# Patient Record
Sex: Female | Born: 1995 | Race: Black or African American | Hispanic: No | Marital: Single | Smoking: Never smoker
Health system: Southern US, Community
[De-identification: ages and names within clinical notes are randomized; demographics above are authoritative.]

## PROBLEM LIST (undated history)

## (undated) HISTORY — PX: TONSILLECTOMY: SUR1361

## (undated) HISTORY — PX: LYMPH GLAND EXCISION: SHX13

---
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
## 2017-01-06 ENCOUNTER — Ambulatory Visit (INDEPENDENT_AMBULATORY_CARE_PROVIDER_SITE_OTHER): Payer: Self-pay | Admitting: *Deleted

## 2017-01-06 DIAGNOSIS — J309 Allergic rhinitis, unspecified: Secondary | ICD-10-CM

## 2017-01-10 ENCOUNTER — Ambulatory Visit (INDEPENDENT_AMBULATORY_CARE_PROVIDER_SITE_OTHER): Payer: Self-pay

## 2017-01-10 DIAGNOSIS — J309 Allergic rhinitis, unspecified: Secondary | ICD-10-CM

## 2017-01-20 ENCOUNTER — Ambulatory Visit (INDEPENDENT_AMBULATORY_CARE_PROVIDER_SITE_OTHER): Payer: Self-pay

## 2017-01-20 DIAGNOSIS — J309 Allergic rhinitis, unspecified: Secondary | ICD-10-CM

## 2017-02-09 ENCOUNTER — Ambulatory Visit (INDEPENDENT_AMBULATORY_CARE_PROVIDER_SITE_OTHER): Payer: Self-pay

## 2017-02-09 DIAGNOSIS — J309 Allergic rhinitis, unspecified: Secondary | ICD-10-CM

## 2017-03-02 ENCOUNTER — Ambulatory Visit (INDEPENDENT_AMBULATORY_CARE_PROVIDER_SITE_OTHER): Payer: Self-pay

## 2017-03-02 DIAGNOSIS — J309 Allergic rhinitis, unspecified: Secondary | ICD-10-CM

## 2017-03-03 DIAGNOSIS — J3089 Other allergic rhinitis: Secondary | ICD-10-CM

## 2017-03-07 NOTE — Progress Notes (Signed)
VIALS EXP 03-08-18 

## 2017-03-17 ENCOUNTER — Encounter: Payer: Self-pay | Admitting: *Deleted

## 2017-03-28 ENCOUNTER — Ambulatory Visit: Payer: Self-pay

## 2017-06-13 ENCOUNTER — Telehealth: Payer: Self-pay

## 2017-06-13 NOTE — Telephone Encounter (Signed)
Pt has not rec'd inj since October 4.  [facility] nurse Megan wanted confirmation on what dose to give.  Advised to decrease to 0.05 due to length of time.  Confirm pt has had antihistamine & waits 30 minutes.  Then increase dose as tolerated on schedule C.

## 2017-09-01 ENCOUNTER — Telehealth: Payer: Self-pay | Admitting: *Deleted

## 2017-09-01 NOTE — Telephone Encounter (Signed)
Patient walked into the clinic today wanting to get allergy injections, she did not have an appt. She takes vials out of office to [facility] and we did not have her vials here. I called the school (info below) and she has not had an injection since 05/05/17 but they said she did have a new red vial that she has not started that does not expire until 03/08/18. For cost efficiency, the pt needs to pick up the red vial from the school and bring to us, for us to be able to mix down to green. Explained this to pt's mother.    Scanned pts allergy injection shot records into Epic.   [facility] [address] [place], [place] [id number] [phone](P) [phone](F) [phone]

## 2017-09-01 NOTE — Telephone Encounter (Signed)
LVM stating that pt needs to make ov. The school faxed us a consent form for the dr to sign so that she may continue getting shots at the school.

## 2017-09-01 NOTE — Telephone Encounter (Signed)
Just noticed that pt is due to have an office visit with Isabella Huff before any more allergy vials can be made.

## 2017-09-12 ENCOUNTER — Ambulatory Visit: Payer: Self-pay

## 2017-09-30 ENCOUNTER — Ambulatory Visit (INDEPENDENT_AMBULATORY_CARE_PROVIDER_SITE_OTHER): Payer: BLUE CROSS/BLUE SHIELD | Admitting: Allergy & Immunology

## 2017-09-30 ENCOUNTER — Encounter: Payer: Self-pay | Admitting: Allergy & Immunology

## 2017-09-30 VITALS — BP 110/80 | HR 82 | Temp 98.2°F | Resp 16 | Ht 70.0 in | Wt 194.0 lb

## 2017-09-30 DIAGNOSIS — J4520 Mild intermittent asthma, uncomplicated: Secondary | ICD-10-CM | POA: Diagnosis not present

## 2017-09-30 DIAGNOSIS — J3089 Other allergic rhinitis: Secondary | ICD-10-CM

## 2017-09-30 DIAGNOSIS — J302 Other seasonal allergic rhinitis: Secondary | ICD-10-CM | POA: Diagnosis not present

## 2017-09-30 MED ORDER — EPINEPHRINE 0.3 MG/0.3ML IJ SOAJ: 0.3000 mg | Freq: Once | INTRAMUSCULAR | 2 refills | Status: AC

## 2017-09-30 NOTE — Progress Notes (Signed)
NEW PATIENT  Date of Service/Encounter:  09/30/17  Referring provider: Alfred LevinsGoolsby, Kirsten L., PA-C   Assessment:   Mild intermittent asthma without complication  Seasonal and perennial allergic rhinitis  (grasses, ragweed, weeds, trees, molds, dust mite, cat)  Plan/Recommendations:   1. Mild intermittent asthma without complication - Lung testing looked great today. - Continue with albuterol as needed. - This can likely be removed from her diagnosis list at the next visit.   2. Seasonal and perennial allergic rhinitis (grasses, ragweed, weeds, trees, molds, dust mite, cat) - We will give you two shots today and send you back with your vials.  - Please have the clinic call us if there are any questions. - Continue with Xyzal 5mg  daily as needed. - We deferred repeating testing today since she has been stable on her current immunotherapy vial. - We had a discussion about the normal duration of treatment with allergen immunotherapy, but she prefers to hold off on stopping at this point. - We will revisit this at the next appointment.   3. Return in about 1 year (around 10/01/2018).   Subjective:   Isabella Huff is a 22 y.o. female presenting today for evaluation of  Chief Complaint  Patient presents with  . Allergies    Isabella Huff has a history of the following: Patient Active Problem List   Diagnosis Date Noted  . Asthma 03/31/2015  . Allergic rhinitis 03/31/2015    History obtained from: chart review and patient.  Isabella Huff was referred by Alfred LevinsGoolsby, Kirsten L., PA-C.     Isabella Huff is a 22 y.o. female presenting to re-establish care. She has a history of perennial and seasonal allergic rhinitis as well as intermittent asthma. She was last seen by Dr. Clydie BraunBhatti in September 2015. At that time, she was doing very well on her allergy shots. The plan at that time was to continue this for one more year and then re-evaluate to see whether shots were necessary. Asthma was  under good control without an exacerbations or symptoms in years.   Since she was last seen in an office visit, she has done generally well. She did start college at [employer] and transferred her vials to the health center on campus. She was getting one injection every three weeks. Her last injection, however, was in October 2018. She has felt worse since stopping her injections. She reports that she had stress last semester, which kept her form getting her allergy shots. She is not interested in stopping her allergy shots at this time.   From a medication perspective, she is on minimal medications. She does have Xyzal which she takes as needed. She does have a nose spray but never uses it. She prefers to remain off of medications. She gets no sinus infections and in general her symptoms are markedly improved compared to when she started allergy shots when she was in a 6th grade. She was initially seen in November 2010, and started allergy injections after that visit.   Isabella Huff's asthma has been well controlled. She has not required rescue medication, experienced nocturnal awakenings due to lower respiratory symptoms, nor have activities of daily living been limited. She has required no Emergency Department or Urgent Care visits for her asthma. She has required zero courses of systemic steroids for asthma exacerbations since the last visit. ACT score today is 25, indicating excellent asthma symptom control. She does not remember the last time that she used her rescue inhaler.   Otherwise, there is  no history of other atopic diseases, including drug allergies, food allergies, stinging insect allergies, or urticaria. There is no significant infectious history. Vaccinations are up to date. 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 She enjoys living in [place] and is unsure what she is doing after graduation.    Past Medical History: Patient  Active Problem List   Diagnosis Date Noted  . Asthma 03/31/2015  . Allergic rhinitis 03/31/2015    Medication List:  Allergies as of 09/30/2017   No Known Allergies     Medication List        Accurate as of 09/30/17 10:03 AM. Always use your most recent med list.          desogestrel-ethinyl estradiol 0.15-0.02/0.01 MG (21/5) tablet Commonly known as:  KARIVA,AZURETTE,MIRCETTE Take by mouth.   EPIPEN 2-PAK IJ Inject 0.3 mg as directed as needed.   ibuprofen 600 MG tablet Commonly known as:  ADVIL,MOTRIN Take 1 tablet (600 mg total) by mouth every 6 (six) hours as needed for pain.   levocetirizine 5 MG tablet Commonly known as:  XYZAL Take 5 mg by mouth daily as needed for allergies.   loratadine 10 MG tablet Commonly known as:  CLARITIN TAKE 1 TABLET BY MOUTH EVERY DAY**NEED OFFICE VISIT**   PROVENTIL HFA 108 (90 Base) MCG/ACT inhaler Generic drug:  albuterol Inhale 2 puffs into the lungs every 6 (six) hours as needed for wheezing or shortness of breath.       Birth History: non-contributory.   Developmental History: non-contributory.   Past Surgical History: Past Surgical History:  Procedure Laterality Date  . LYMPH GLAND EXCISION    . TONSILLECTOMY       Family History: Family History  Problem Relation Age of Onset  . Allergic rhinitis Mother   . Allergic rhinitis Brother   . Migraines Maternal Aunt   . Asthma Neg Hx   . Eczema Neg Hx   . Urticaria Neg Hx   . Immunodeficiency Neg Hx   . Angioedema Neg Hx      Social History: Isabella Huff lives at home with her family here in [place]. They live in a [place] year old home. There is hardwood in the main living areas and carpeting in the bedrooms. There is gas heating and central cooling. There is one indoor dor. There are dust mite coverings on her bedding and no tobacco exposure. 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      Review of Systems: a 14-point review of systems is pertinent for what is  mentioned in HPI.  Otherwise, all other systems were negative. Constitutional: negative other than that listed in the HPI Eyes: negative other than that listed in the HPI Ears, nose, mouth, throat, and face: negative other than that listed in the HPI Respiratory: negative other than that listed in the HPI Cardiovascular: negative other than that listed in the HPI Gastrointestinal: negative other than that listed in the HPI Genitourinary: negative other than that listed in the HPI Integument: negative other than that listed in the HPI Hematologic: negative other than that listed in the HPI Musculoskeletal: negative other than that listed in the HPI Neurological: negative other than that listed in the HPI Allergy/Immunologic: negative other than that listed in the HPI    Objective:   Blood pressure 110/80, pulse 82, temperature 98.2 F (36.8 C), temperature source Oral, resp. rate 16, height 5\' 10"  (1.778 m), weight  194 lb (88 kg), SpO2 97 %. Body mass index is 27.84 kg/m.   Physical Exam:  General: Alert, interactive, in no acute distress. Very pleasant and smiling.  Eyes: No conjunctival injection bilaterally, no discharge on the right, no discharge on the left and no Horner-Trantas dots present. PERRL bilaterally. EOMI without pain. No photophobia.  Ears: Right TM pearly gray with normal light reflex, Left TM pearly gray with normal light reflex, Right TM intact without perforation and Left TM intact without perforation.  Nose/Throat: External nose within normal limits and septum midline. Turbinates edematous with clear discharge. Posterior oropharynx erythematous without cobblestoning in the posterior oropharynx. Tonsils 2+ without exudates.  Tongue without thrush. Neck: Supple without thyromegaly. Trachea midline. Adenopathy: no enlarged lymph nodes appreciated in the anterior cervical, occipital, axillary, epitrochlear, inguinal, or popliteal regions. Lungs: Clear to auscultation  without wheezing, rhonchi or rales. No increased work of breathing. CV: Normal S1/S2. No murmurs. Capillary refill <2 seconds.  Abdomen: Nondistended, nontender. No guarding or rebound tenderness. Bowel sounds present in all fields and hypoactive  Skin: Warm and dry, without lesions or rashes. Extremities:  No clubbing, cyanosis or edema. Neuro:   Grossly intact. No focal deficits appreciated. Responsive to questions.  Diagnostic studies:   Spirometry: results normal (FEV1: 3.41/97%, FVC: 3.46/86%, FEV1/FVC: 99%).    Spirometry consistent with normal pattern.   Allergy Studies: none    Malachi Bonds, MD [facility]

## 2017-09-30 NOTE — Patient Instructions (Addendum)
1. Mild intermittent asthma without complication - Lung testing looked great today. - We will remove this from your diagnosis list since you have not needed your inhaler in so long. - Let us know if you have any problems with your breathing and we can revisit this.  2. Seasonal and perennial allergic rhinitis - We will give you two shots today and send you back with your vials. - Please have the clinic call us if there are any questions. - Continue with Xyzal 5mg  daily as needed.  3. Return in about 1 year (around 10/01/2018).   Please inform us of any Emergency Department visits, hospitalizations, or changes in symptoms. Call us before going to the ED for breathing or allergy symptoms since we might be able to fit you in for a sick visit. Feel free to contact us anytime with any questions, problems, or concerns.  It was a pleasure to meet you today! Good luck with the rest of the semester!   Websites that have reliable patient information: 1. American Academy of Asthma, Allergy, and Immunology: www.aaaai.org 2. Food Allergy Research and Education (FARE): foodallergy.org 3. Mothers of Asthmatics: http://www.asthmacommunitynetwork.org 4. American College of Allergy, Asthma, and Immunology: www.acaai.org

## 2017-12-09 ENCOUNTER — Ambulatory Visit (INDEPENDENT_AMBULATORY_CARE_PROVIDER_SITE_OTHER): Payer: BLUE CROSS/BLUE SHIELD | Admitting: *Deleted

## 2017-12-09 DIAGNOSIS — J309 Allergic rhinitis, unspecified: Secondary | ICD-10-CM

## 2017-12-13 ENCOUNTER — Ambulatory Visit (INDEPENDENT_AMBULATORY_CARE_PROVIDER_SITE_OTHER): Payer: BLUE CROSS/BLUE SHIELD

## 2017-12-13 DIAGNOSIS — J309 Allergic rhinitis, unspecified: Secondary | ICD-10-CM

## 2017-12-21 ENCOUNTER — Ambulatory Visit (INDEPENDENT_AMBULATORY_CARE_PROVIDER_SITE_OTHER): Payer: BLUE CROSS/BLUE SHIELD

## 2017-12-21 DIAGNOSIS — J309 Allergic rhinitis, unspecified: Secondary | ICD-10-CM

## 2017-12-30 ENCOUNTER — Ambulatory Visit (INDEPENDENT_AMBULATORY_CARE_PROVIDER_SITE_OTHER): Payer: BLUE CROSS/BLUE SHIELD

## 2017-12-30 DIAGNOSIS — J309 Allergic rhinitis, unspecified: Secondary | ICD-10-CM | POA: Diagnosis not present

## 2018-01-12 ENCOUNTER — Ambulatory Visit (INDEPENDENT_AMBULATORY_CARE_PROVIDER_SITE_OTHER): Payer: BLUE CROSS/BLUE SHIELD | Admitting: *Deleted

## 2018-01-12 DIAGNOSIS — J309 Allergic rhinitis, unspecified: Secondary | ICD-10-CM

## 2018-01-19 ENCOUNTER — Ambulatory Visit (INDEPENDENT_AMBULATORY_CARE_PROVIDER_SITE_OTHER): Payer: BLUE CROSS/BLUE SHIELD | Admitting: *Deleted

## 2018-01-19 DIAGNOSIS — J309 Allergic rhinitis, unspecified: Secondary | ICD-10-CM

## 2018-01-23 ENCOUNTER — Ambulatory Visit (INDEPENDENT_AMBULATORY_CARE_PROVIDER_SITE_OTHER): Payer: BLUE CROSS/BLUE SHIELD

## 2018-01-23 DIAGNOSIS — J309 Allergic rhinitis, unspecified: Secondary | ICD-10-CM

## 2018-02-08 ENCOUNTER — Ambulatory Visit (INDEPENDENT_AMBULATORY_CARE_PROVIDER_SITE_OTHER): Payer: BLUE CROSS/BLUE SHIELD

## 2018-02-08 DIAGNOSIS — J309 Allergic rhinitis, unspecified: Secondary | ICD-10-CM

## 2018-02-16 ENCOUNTER — Ambulatory Visit (INDEPENDENT_AMBULATORY_CARE_PROVIDER_SITE_OTHER): Payer: BLUE CROSS/BLUE SHIELD | Admitting: *Deleted

## 2018-02-16 DIAGNOSIS — J309 Allergic rhinitis, unspecified: Secondary | ICD-10-CM

## 2018-02-27 DIAGNOSIS — J301 Allergic rhinitis due to pollen: Secondary | ICD-10-CM | POA: Diagnosis not present

## 2018-02-27 NOTE — Progress Notes (Signed)
VIALS EXP 02-28-19

## 2018-02-28 DIAGNOSIS — J3089 Other allergic rhinitis: Secondary | ICD-10-CM

## 2018-03-07 ENCOUNTER — Ambulatory Visit (INDEPENDENT_AMBULATORY_CARE_PROVIDER_SITE_OTHER): Payer: BLUE CROSS/BLUE SHIELD | Admitting: *Deleted

## 2018-03-07 DIAGNOSIS — J309 Allergic rhinitis, unspecified: Secondary | ICD-10-CM | POA: Diagnosis not present

## 2018-03-14 ENCOUNTER — Ambulatory Visit (INDEPENDENT_AMBULATORY_CARE_PROVIDER_SITE_OTHER): Payer: BLUE CROSS/BLUE SHIELD | Admitting: *Deleted

## 2018-03-14 DIAGNOSIS — J309 Allergic rhinitis, unspecified: Secondary | ICD-10-CM | POA: Diagnosis not present

## 2018-03-14 NOTE — Progress Notes (Signed)
Immunotherapy   Patient Details  Name: Isabella Huff MRN: [id number] Date of Birth: 1995-08-12  03/14/2018  Isabella Huff here to pick up  gold vials to take to school Following schedule: B  Frequency:1 time per week Epi-Pen:Epi-Pen Available  Consent signed and patient instructions given.   Maurine SimmeringLogan D Elexa Kivi 03/14/2018, 2:01 PM

## 2018-06-08 ENCOUNTER — Ambulatory Visit: Payer: Self-pay

## 2018-06-08 NOTE — Progress Notes (Deleted)
+-+                                                                        Immunotherapy   Patient Details  Name: Talajah Slimp MRN: [id number] Date of Birth: 1996-06-21  06/08/2018  Deri Fuelling pts mom here to pick up green vials for pt to take back to [facility]  Following schedule: a  Frequency:once a week Epi-Pen:yes Consent signed and patient instructions given.   Berna Bue 06/08/2018, 3:16 PM

## 2018-06-09 IMAGING — CR DG ABDOMEN 1V
2 series · 2 of 2 positions shown · non-contrast
Comparison: None.

CLINICAL DATA: LEFT lower abdominal pain for 3 days.

EXAM:
ABDOMEN - 1 VIEW

[t abdomen supine (1 of 2)]
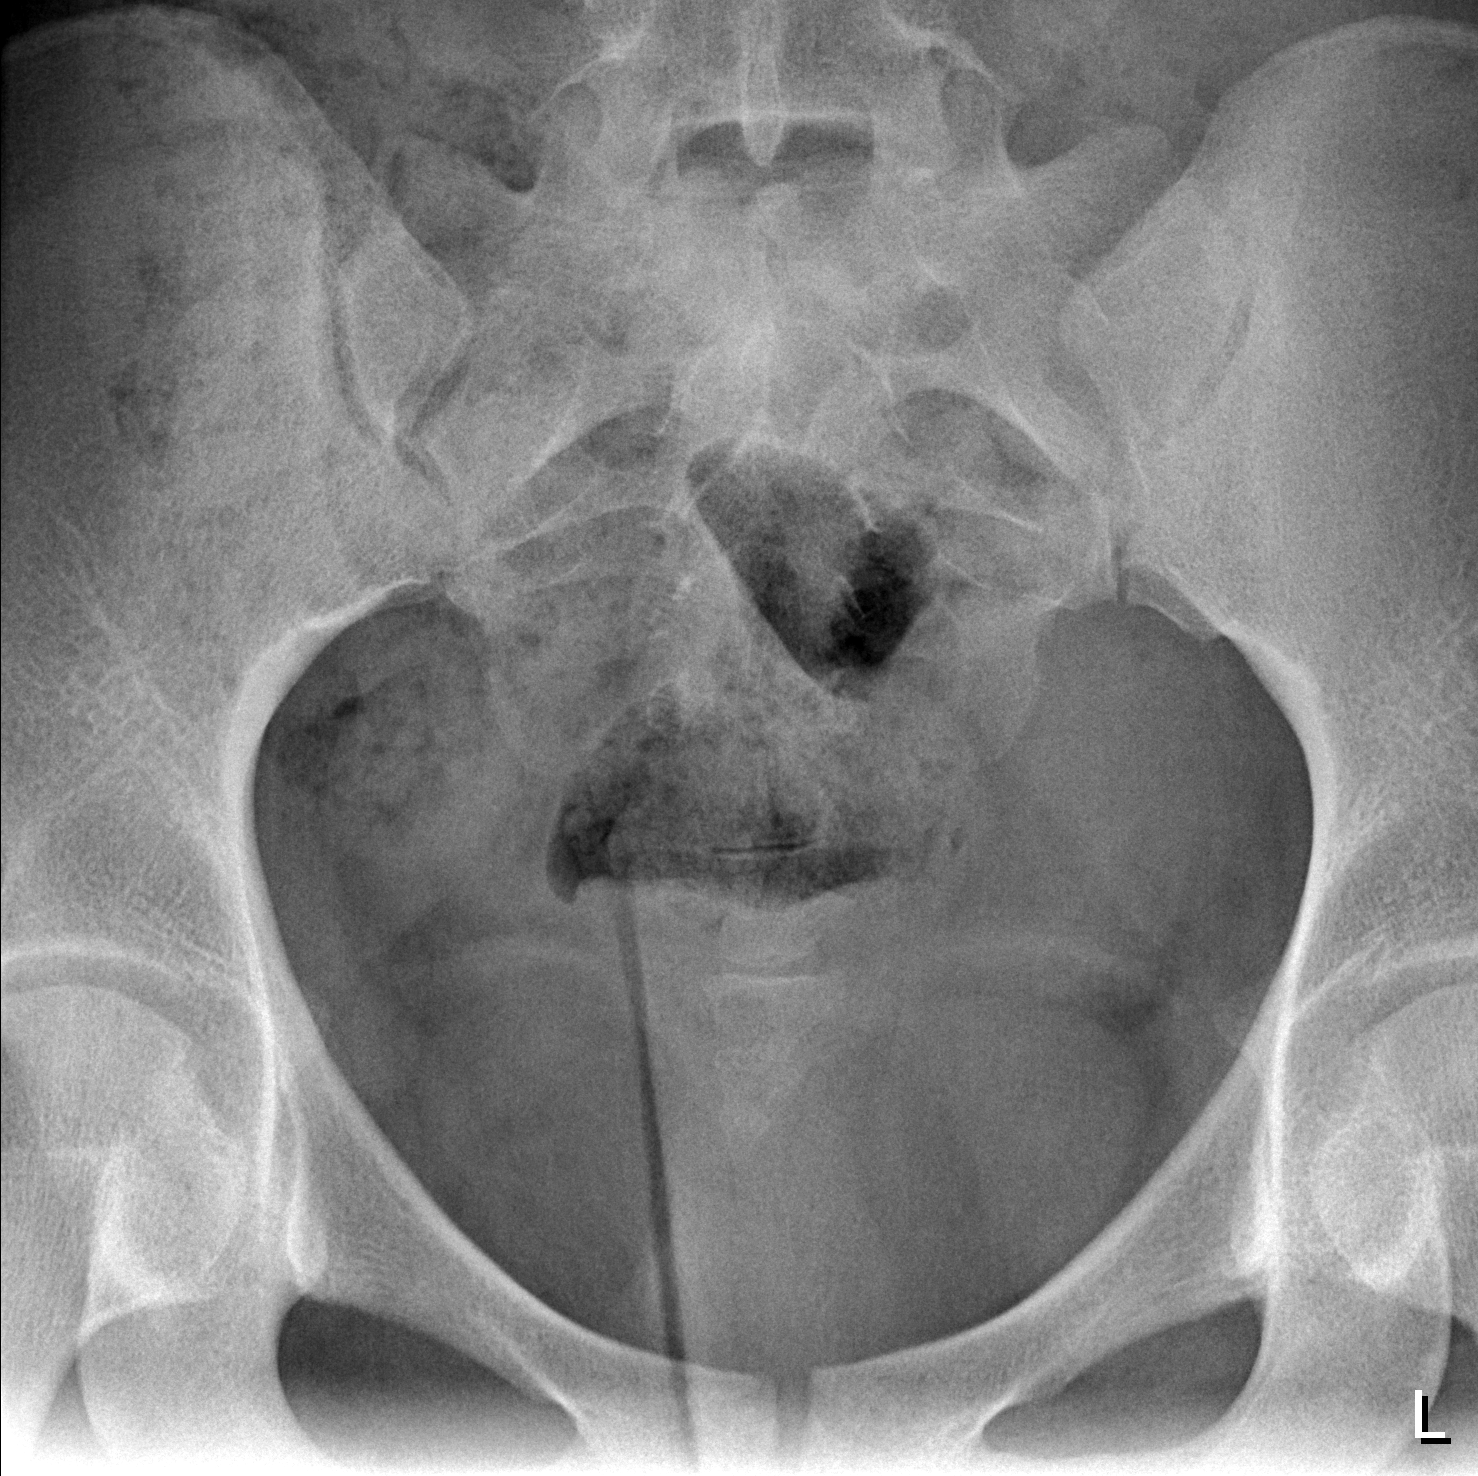

[t abdomen supine (2 of 2)]
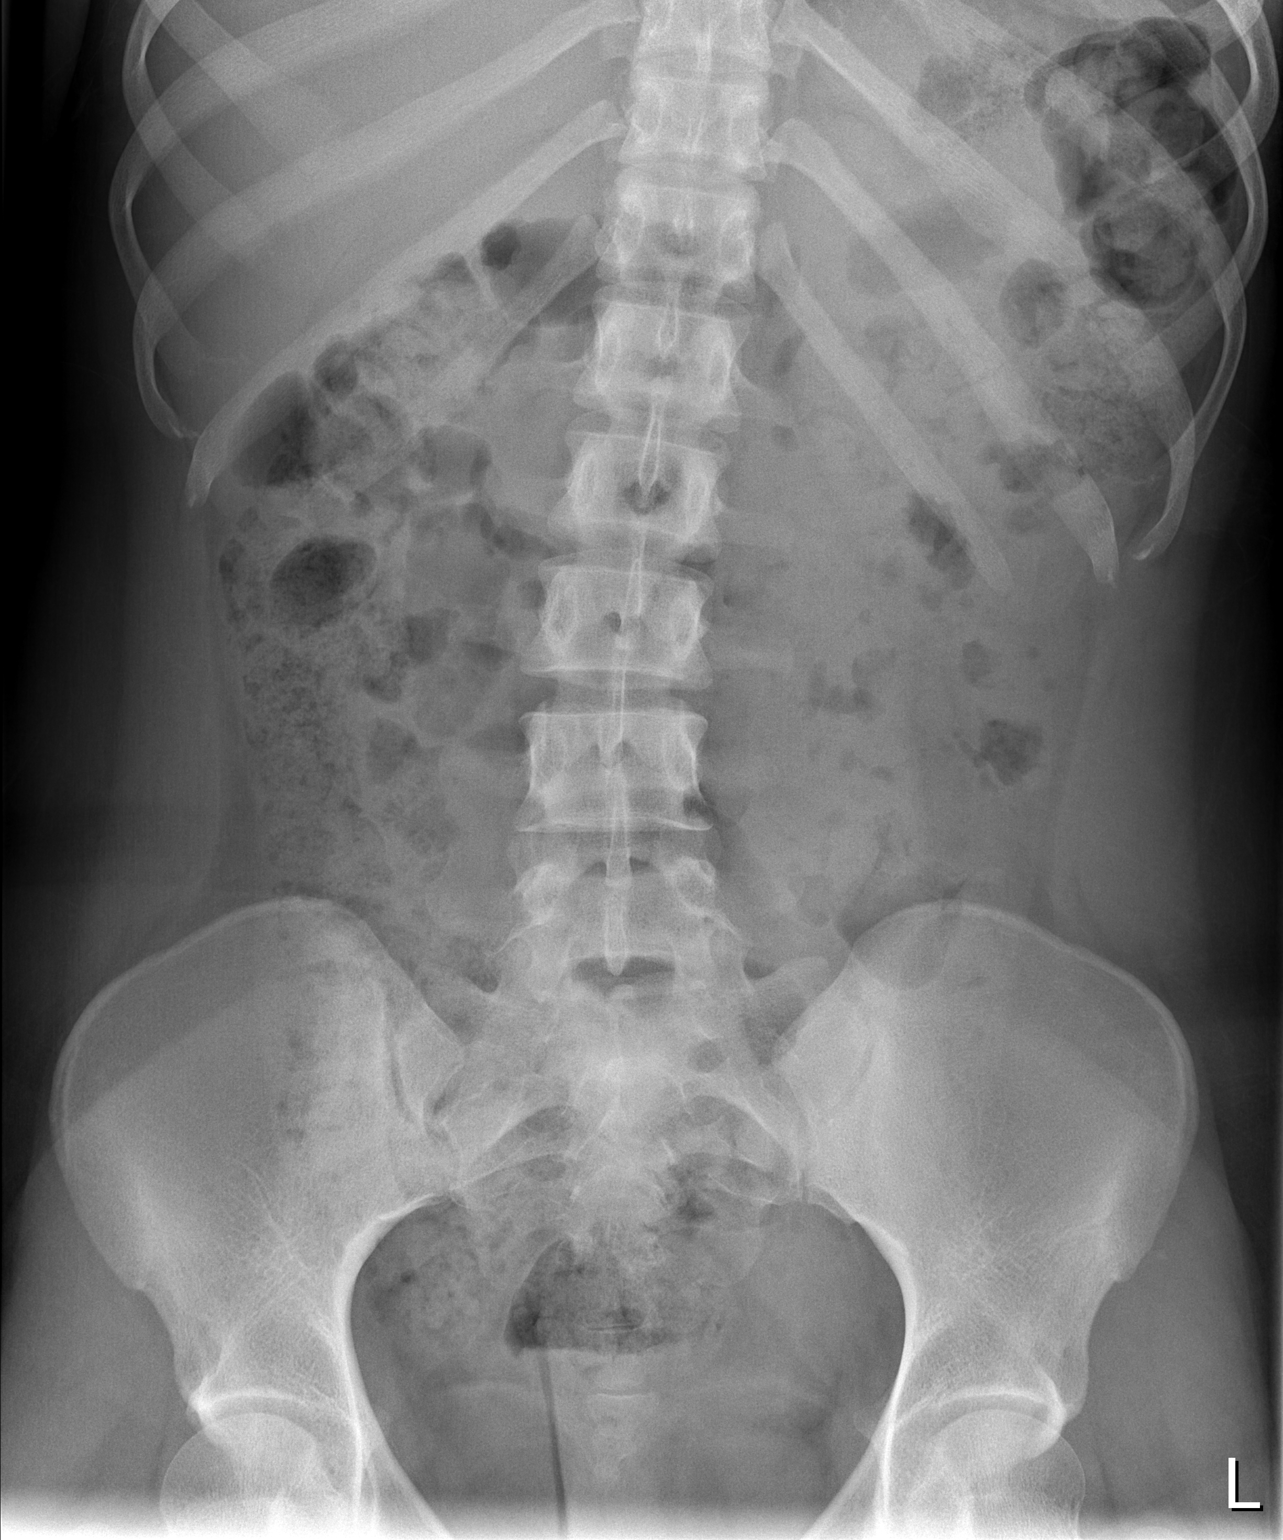

[2 of 2 positions shown; findings below may reference images not displayed]

FINDINGS: The bowel gas pattern is normal. Moderate amount of retained large
bowel stool. No radio-opaque calculi or other significant
radiographic abnormality are seen. Linear air density RIGHT pelvis,
nonspecific and may be external to the patient.
IMPRESSION: Moderate amount of retained large bowel stool. Normal bowel gas
pattern.

## 2018-06-16 ENCOUNTER — Telehealth: Payer: Self-pay

## 2018-06-16 NOTE — Telephone Encounter (Signed)
Amy calling from [facility]. [place] office. Edyth hasn't received an injection in a month on the gold vial 1:10,000. Amy and I discussed that since she hasn't had any reactions we will give her 0.40 and she will have enough in her vial to do the next 2 doses then move onto the green vial 1:1000.

## 2018-10-06 ENCOUNTER — Ambulatory Visit: Payer: BLUE CROSS/BLUE SHIELD | Admitting: Allergy & Immunology

## 2019-01-01 DIAGNOSIS — J3089 Other allergic rhinitis: Secondary | ICD-10-CM

## 2019-01-01 NOTE — Progress Notes (Signed)
VIALS EXP 01-01-2020

## 2019-01-02 DIAGNOSIS — J301 Allergic rhinitis due to pollen: Secondary | ICD-10-CM

## 2019-01-04 ENCOUNTER — Ambulatory Visit: Payer: Self-pay

## 2019-01-04 DIAGNOSIS — J309 Allergic rhinitis, unspecified: Secondary | ICD-10-CM

## 2019-01-04 NOTE — Progress Notes (Signed)
Immunotherapy   Patient Details  Name: Isabella Huff MRN: [id number] Date of Birth: [birth year]  01/04/2019  Deri Fuelling mom came and picked up gold vials 1:10,000 grass-tree and w-m-dm-c injections not given to pt.  Following schedule: B  Pt. Receiving her allergy injections at [facility]. [facility] office. Phone number [phone] fax number [phone] pt. Had to repeat gold bc last injection was 05/19/2018 Frequency:1 time per week Epi-Pen:Epi-Pen Available  Consent signed and patient instructions given.   [name] 01/04/2019, 4:48 PM

## 2020-01-07 ENCOUNTER — Ambulatory Visit: Payer: BLUE CROSS/BLUE SHIELD | Admitting: Allergy & Immunology

## 2020-06-29 ENCOUNTER — Emergency Department (HOSPITAL_BASED_OUTPATIENT_CLINIC_OR_DEPARTMENT_OTHER): Payer: 59

## 2020-06-29 ENCOUNTER — Emergency Department (HOSPITAL_BASED_OUTPATIENT_CLINIC_OR_DEPARTMENT_OTHER)
Admission: EM | Admit: 2020-06-29 | Discharge: 2020-06-29 | Disposition: A | Discharge status: Home / Self Care | Payer: 59 | Attending: Emergency Medicine | Admitting: Emergency Medicine

## 2020-06-29 ENCOUNTER — Encounter (HOSPITAL_BASED_OUTPATIENT_CLINIC_OR_DEPARTMENT_OTHER): Payer: Self-pay | Admitting: Emergency Medicine

## 2020-06-29 ENCOUNTER — Other Ambulatory Visit: Payer: Self-pay

## 2020-06-29 DIAGNOSIS — B9689 Other specified bacterial agents as the cause of diseases classified elsewhere: Secondary | ICD-10-CM | POA: Diagnosis not present

## 2020-06-29 DIAGNOSIS — O26851 Spotting complicating pregnancy, first trimester: Secondary | ICD-10-CM | POA: Insufficient documentation

## 2020-06-29 DIAGNOSIS — J45909 Unspecified asthma, uncomplicated: Secondary | ICD-10-CM | POA: Diagnosis not present

## 2020-06-29 DIAGNOSIS — Z3A01 Less than 8 weeks gestation of pregnancy: Secondary | ICD-10-CM | POA: Insufficient documentation

## 2020-06-29 DIAGNOSIS — O208 Other hemorrhage in early pregnancy: Secondary | ICD-10-CM | POA: Insufficient documentation

## 2020-06-29 DIAGNOSIS — N760 Acute vaginitis: Secondary | ICD-10-CM | POA: Insufficient documentation

## 2020-06-29 DIAGNOSIS — O418X90 Other specified disorders of amniotic fluid and membranes, unspecified trimester, not applicable or unspecified: Secondary | ICD-10-CM

## 2020-06-29 DIAGNOSIS — O26891 Other specified pregnancy related conditions, first trimester: Secondary | ICD-10-CM | POA: Insufficient documentation

## 2020-06-29 DIAGNOSIS — N939 Abnormal uterine and vaginal bleeding, unspecified: Secondary | ICD-10-CM

## 2020-06-29 DIAGNOSIS — O4690 Antepartum hemorrhage, unspecified, unspecified trimester: Secondary | ICD-10-CM

## 2020-06-29 LAB — WET PREP, GENITAL
Sperm: NONE SEEN
Trich, Wet Prep: NONE SEEN
Yeast Wet Prep HPF POC: NONE SEEN

## 2020-06-29 LAB — CBC WITH DIFFERENTIAL/PLATELET
Abs Immature Granulocytes: 0.02 10*3/uL (ref 0.00–0.07)
Basophils Absolute: 0 10*3/uL (ref 0.0–0.1)
Basophils Relative: 0 %
Eosinophils Absolute: 0.1 10*3/uL (ref 0.0–0.5)
Eosinophils Relative: 1 %
HCT: 37.3 % (ref 36.0–46.0)
Hemoglobin: 12.5 g/dL (ref 12.0–15.0)
Immature Granulocytes: 0 %
Lymphocytes Relative: 28 %
Lymphs Abs: 2 10*3/uL (ref 0.7–4.0)
MCH: 30.5 pg (ref 26.0–34.0)
MCHC: 33.5 g/dL (ref 30.0–36.0)
MCV: 91 fL (ref 80.0–100.0)
Monocytes Absolute: 0.7 10*3/uL (ref 0.1–1.0)
Monocytes Relative: 10 %
Neutro Abs: 4.2 10*3/uL (ref 1.7–7.7)
Neutrophils Relative %: 61 %
Platelets: 271 10*3/uL (ref 150–400)
RBC: 4.1 MIL/uL (ref 3.87–5.11)
RDW: 12.6 % (ref 11.5–15.5)
WBC: 7.1 10*3/uL (ref 4.0–10.5)
nRBC: 0 % (ref 0.0–0.2)

## 2020-06-29 LAB — HCG, QUANTITATIVE, PREGNANCY: hCG, Beta Chain, Quant, S: 14519 m[IU]/mL — ABNORMAL HIGH (ref <5)

## 2020-06-29 LAB — PREGNANCY, URINE: Preg Test, Ur: POSITIVE — AB

## 2020-06-29 MED ORDER — METRONIDAZOLE 500 MG PO TABS: 500.0000 mg | ORAL_TABLET | Freq: Two times a day (BID) | ORAL | 0 refills | Status: DC

## 2020-06-29 NOTE — ED Notes (Signed)
I have requested phlebotomy assistance from our tech.Tammy Sours. Will do blood draw shortly.

## 2020-06-29 NOTE — ED Notes (Signed)
As I write this, my colleague, Casimiro Needle is performing u/s-guided IV insertion/lab draw.

## 2020-06-29 NOTE — ED Triage Notes (Signed)
Pt c/o vaginal spotting x 2-3 days. Pt did home pregnancy test that was positive.

## 2020-06-29 NOTE — ED Provider Notes (Addendum)
[facility] EMERGENCY DEPARTMENT Provider Note   CSN: [id number] Arrival date & time: 06/29/20  1228     History Chief Complaint  Patient presents with  . Vaginal Bleeding  . Possible Pregnancy    Isabella Huff is a 24 y.o. female past medical history of asthma, allergic rhinitis who presents for evaluation of vaginal bleeding, abdominal cramping has been ongoing for about 3 days.  She states about 5 days ago, she took an at-home pregnancy test that was positive.  She reports her last menstrual cycle was 05/29/20.  She has never been pregnant before.  She reports that about 5 days ago, she started having some vaginal spotting.  She states that it is light pink in color.  She states she is only going through about 1 pad a day and is not fully saturating them.  She states she also started having some lower abdominal cramping/pain.  She states it is worse in the lower abdomen into the left.  No fevers, vomiting  The history is provided by the patient.       History reviewed. No pertinent past medical history.  Patient Active Problem List   Diagnosis Date Noted  . Asthma 03/31/2015  . Allergic rhinitis 03/31/2015    Past Surgical History:  Procedure Laterality Date  . LYMPH GLAND EXCISION    . TONSILLECTOMY       OB History   No obstetric history on file.     Family History  Problem Relation Age of Onset  . Allergic rhinitis Mother   . Allergic rhinitis Brother   . Migraines Maternal Aunt   . Asthma Neg Hx   . Eczema Neg Hx   . Urticaria Neg Hx   . Immunodeficiency Neg Hx   . Angioedema Neg Hx     Social History   Tobacco Use  . Smoking status: Never Smoker  . Smokeless tobacco: Never Used  Vaping Use  . Vaping Use: Never used  Substance Use Topics  . Alcohol use: No  . Drug use: No    Home Medications Prior to Admission medications   Medication Sig Start Date End Date Taking? Authorizing Provider  albuterol (PROVENTIL HFA) 108 (90 BASE)  MCG/ACT inhaler Inhale 2 puffs into the lungs every 6 (six) hours as needed for wheezing or shortness of breath.    [provider]  desogestrel-ethinyl estradiol (KARIVA,AZURETTE,MIRCETTE) 0.15-0.02/0.01 MG (21/5) tablet Take by mouth. 12/31/15   [provider]  EPINEPHrine (EPIPEN 2-PAK IJ) Inject 0.3 mg as directed as needed.    [provider]  ibuprofen (ADVIL,MOTRIN) 600 MG tablet Take 1 tablet (600 mg total) by mouth every 6 (six) hours as needed for pain. 05/11/13   Palumbo, April, MD  levocetirizine (XYZAL) 5 MG tablet Take 5 mg by mouth daily as needed for allergies.    [provider]  loratadine (CLARITIN) 10 MG tablet TAKE 1 TABLET BY MOUTH EVERY DAY**NEED OFFICE VISIT** Patient not taking: Reported on 09/30/2017 05/26/15   Mikki Santee, MD  metroNIDAZOLE (FLAGYL) 500 MG tablet Take 1 tablet (500 mg total) by mouth 2 (two) times daily. 06/29/20   Maxwell Caul, PA-C    Allergies    Patient has no known allergies.  Review of Systems   Review of Systems  Constitutional: Negative for fever.  Respiratory: Negative for cough and shortness of breath.   Cardiovascular: Negative for chest pain.  Gastrointestinal: Positive for abdominal pain. Negative for nausea and vomiting.  Genitourinary: Positive for  vaginal bleeding. Negative for dysuria and hematuria.  Neurological: Negative for headaches.  All other systems reviewed and are negative.   Physical Exam Updated Vital Signs BP 120/72 (BP Location: Left Arm)   Pulse 64   Temp 98.3 F (36.8 C) (Oral)   Resp 18   Ht 5\' 10"  (1.778 m)   Wt 80.7 kg   LMP 05/21/2020   SpO2 100%   BMI 25.54 kg/m   Physical Exam Vitals and nursing note reviewed. Exam conducted with a chaperone present.  Constitutional:      Appearance: Normal appearance. She is well-developed.  HENT:     Head: Normocephalic and atraumatic.  Eyes:     General: Lids are normal.     Conjunctiva/sclera: Conjunctivae  normal.     Pupils: Pupils are equal, round, and reactive to light.  Cardiovascular:     Rate and Rhythm: Normal rate and regular rhythm.     Pulses: Normal pulses.     Heart sounds: Normal heart sounds. No murmur heard.  No friction rub. No gallop.   Pulmonary:     Effort: Pulmonary effort is normal.     Breath sounds: Normal breath sounds.  Abdominal:     Palpations: Abdomen is soft. Abdomen is not rigid.     Tenderness: There is abdominal tenderness in the suprapubic area and left lower quadrant. There is no right CVA tenderness, left CVA tenderness or guarding.     Comments: Abdomen is soft, nondistended.  Tenderness palpation of the suprapubic region left lower quadrant.  No rigidity, guarding.  Genitourinary:    Comments: The exam was performed with a chaperone present 05/23/2020, Konrad Felix). Normal external female genitalia. No lesions, rash, or sores.  No vaginal bleeding.  Small mount of discharge noted in cervix.No CMT.  No adnexal mass or tenderness noted bilaterally. Musculoskeletal:        General: Normal range of motion.     Cervical back: Full passive range of motion without pain.  Skin:    General: Skin is warm and dry.     Capillary Refill: Capillary refill takes less than 2 seconds.  Neurological:     Mental Status: She is alert and oriented to person, place, and time.  Psychiatric:        Speech: Speech normal.     ED Results / Procedures / Treatments   Labs (all labs ordered are listed, but only abnormal results are displayed) Labs Reviewed  WET PREP, GENITAL - Abnormal; Notable for the following components:      Result Value   Clue Cells Wet Prep HPF POC PRESENT (*)    WBC, Wet Prep HPF POC MANY (*)    All other components within normal limits  PREGNANCY, URINE - Abnormal; Notable for the following components:   Preg Test, Ur POSITIVE (*)    All other components within normal limits  HCG, QUANTITATIVE, PREGNANCY - Abnormal; Notable for the following components:    hCG, Beta Chain, Quant, S 14,519 (*)    All other components within normal limits  CBC WITH DIFFERENTIAL/PLATELET  GC/CHLAMYDIA PROBE AMP ([place]) NOT AT [facility]    EKG None  Radiology [facility] OB LESS THAN 14 WEEKS WITH OB TRANSVAGINAL  Result Date: 06/29/2020 CLINICAL DATA:  Cramping, spotting EXAM: OBSTETRIC <14 WK ULTRASOUND TECHNIQUE: Transabdominal ultrasound was performed for evaluation of the gestation as well as the maternal uterus and adnexal regions. COMPARISON:  None. FINDINGS: Intrauterine gestational sac: Single Yolk sac:  Visualized. Embryo:  Visualized.  Cardiac Activity: Visualized. Heart Rate: 99 bpm CRL:   2.2 mm   5 w 5 d                  US EDC: 02/24/2021 Subchorionic hemorrhage:  Small. Maternal uterus/adnexae: Normal appearance of the bilateral ovaries. Trace free fluid within the cul-de-sac, likely physiologic. IMPRESSION: 1. Single live intrauterine gestation measuring 5 weeks 5 days by crown-rump length. 2. Active embryonic heart tones at 99 bpm. 3. Small subchorionic hemorrhage. Electronically Signed   By: Duanne GuessNicholas  Plundo D.O.   On: 06/29/2020 15:55    Procedures Procedures (including critical care time)  Medications Ordered in ED Medications - No data to display  ED Course  I have reviewed the triage vital signs and the nursing notes.  Pertinent labs & imaging results that were available during my care of the patient were reviewed by me and considered in my medical decision making (see chart for details).    MDM Rules/Calculators/A&P                          24 year old female who presents for evaluation of abdominal cramping, vaginal bleeding this been ongoing for about 5 days.  Took a pregnancy test at home that was positive.  States her last menstrual cycle was at the end of October.  She is not currently on birth control.  On this ER, she is afebrile nontoxic-appearing.  Vital signs are stable.  On exam, she has some mild suprapubic abdominal tenderness and  tenderness in left lower quadrant.  Concern for possible threatened miscarriage versus ectopic pregnancy.  Plan to check her beta quant, pelvic exam.  Pregnancy test is positive here.  Will obtain beta quant.  Pelvic exam as documented above.  Patient tolerated procedure well.  She did have some small amount of discharge in the vaginal vault.  Wet prep shows clue cells present. No blood noted on exam.   Beta quant is 14,519.  CBC shows no leukocytosis.  Hemoglobin stable at 12.5. RN informed me that blood hemolyzed and the rest of patient's blood work was cancelled.   Ultrasound shows single live intrauterine gestation measuring 5 weeks, 5 days.  Embryonic heart tones noted at 99 bpm.  There is a small subchorionic hemorrhage.  Discussed results with patient. I discussed with her that the bleeding could be from subchorionic hemorrhage. I did discuss with her that she has an increased risk of missed carriage. At this time, her ultrasound is not concerning for ectopic pregnancy. We also discussed the wet prep showing clue cells. Given that she had large amount of discharge in the vaginal vault, we will plan to treat. We discussed the potential impact of not treating an infection on possible miscarriage. Patient is agreeable. We will give patient OB/GYN referral. Patient instructed to follow-up with them. At this time, patient exhibits no emergent life-threatening condition that require further evaluation in ED. Patient had ample opportunity for questions and discussion. All patient's questions were answered with full understanding. Strict return precautions discussed. Patient expresses understanding and agreement to plan.   Portions of this note were generated with Scientist, clinical (histocompatibility and immunogenetics)Dragon dictation software. Dictation errors may occur despite best attempts at proofreading.   Final Clinical Impression(s) / ED Diagnoses Final diagnoses:  Vaginal bleeding in pregnancy  BV (bacterial vaginosis)  Subchorionic hemorrhage  of placenta, antepartum    Rx / DC Orders ED Discharge Orders         Ordered  metroNIDAZOLE (FLAGYL) 500 MG tablet  2 times daily,   Status:  Discontinued        06/29/20 1640    metroNIDAZOLE (FLAGYL) 500 MG tablet  2 times daily        06/29/20 1648           Maxwell Caul, PA-C 06/29/20 1701    Maxwell Caul, PA-C 06/29/20 1715    Derwood Kaplan, MD 06/30/20 [phone]

## 2020-06-29 NOTE — ED Notes (Signed)
Attempted bloodwork without success.

## 2020-06-29 NOTE — Discharge Instructions (Signed)
You can take 1000 mg of Tylenol.  Do not exceed 4000 mg of Tylenol a day.  Take Flagyl as directed.  It is very important that you do not consume any alcohol while taking this medication as it will cause you to become violently ill.  As we discussed, your ultrasound showed a small subchorionic hemorrhage. This could be the cause of the bleeding. Additionally, your pelvic exam showed evidence of bacterial vaginosis. We will plan to treat this. You have gonorrhea and Chlamydia cultures pending. If you are positive, they will notify you.  It is important that you follow-up with OB/GYN. I put a referral in your fever.  Return the emergency department for any worsening abdominal pain, vaginal bleeding, fevers or any other worsening concerning symptoms.

## 2020-06-30 LAB — GC/CHLAMYDIA PROBE AMP (~~LOC~~) NOT AT ARMC
Chlamydia: NEGATIVE
Comment: NEGATIVE
Comment: NORMAL
Neisseria Gonorrhea: NEGATIVE

## 2020-07-15 ENCOUNTER — Other Ambulatory Visit: Payer: Self-pay

## 2020-07-15 ENCOUNTER — Encounter (HOSPITAL_BASED_OUTPATIENT_CLINIC_OR_DEPARTMENT_OTHER): Payer: Self-pay | Admitting: *Deleted

## 2020-07-15 ENCOUNTER — Emergency Department (HOSPITAL_BASED_OUTPATIENT_CLINIC_OR_DEPARTMENT_OTHER)
Admission: EM | Admit: 2020-07-15 | Discharge: 2020-07-15 | Disposition: A | Discharge status: Home / Self Care | Payer: 59 | Attending: Emergency Medicine | Admitting: Emergency Medicine

## 2020-07-15 DIAGNOSIS — J45909 Unspecified asthma, uncomplicated: Secondary | ICD-10-CM | POA: Diagnosis not present

## 2020-07-15 DIAGNOSIS — Y92410 Unspecified street and highway as the place of occurrence of the external cause: Secondary | ICD-10-CM | POA: Diagnosis not present

## 2020-07-15 DIAGNOSIS — R1031 Right lower quadrant pain: Secondary | ICD-10-CM | POA: Insufficient documentation

## 2020-07-15 DIAGNOSIS — Z3A01 Less than 8 weeks gestation of pregnancy: Secondary | ICD-10-CM | POA: Insufficient documentation

## 2020-07-15 DIAGNOSIS — M549 Dorsalgia, unspecified: Secondary | ICD-10-CM | POA: Insufficient documentation

## 2020-07-15 DIAGNOSIS — O9A211 Injury, poisoning and certain other consequences of external causes complicating pregnancy, first trimester: Secondary | ICD-10-CM | POA: Insufficient documentation

## 2020-07-15 MED ORDER — ACETAMINOPHEN 325 MG PO TABS
650.0000 mg | ORAL_TABLET | Freq: Once | ORAL | Status: AC
  Administered 2020-07-15: 23:00:00 650 mg via ORAL
  Filled 2020-07-15: qty 2

## 2020-07-15 NOTE — ED Notes (Signed)
ED Provider at bedside. 
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
## 2020-08-02 NOTE — L&D Delivery Note (Signed)
Delivery Note At 11:26 PM a viable female was delivered via Vaginal, Spontaneous (Presentation:   Occiput Anterior).  APGAR: 8, 9; weight  pending.   Placenta status: Spontaneous, Intact.  Cord: 3 vessels with the following complications: None.  Cord pH: drawn 1cc arterial but unable to be run  Anesthesia: Epidural Episiotomy: None Lacerations: 1st degree periurethral Suture Repair: 3.0 vicryl rapide , 1 single figure of 8.  Est. Blood Loss (mL):  500cc, treated with uterine massage, TXA and methergine. Uterus and placenta feel warm, will cont to watch for maternal fever.   Mom to postpartum.  Baby to Couplet care / Skin to Skin.  Lendon Colonel 02/08/2021, 11:55 PM

## 2021-02-06 LAB — OB RESULTS CONSOLE GBS: GBS: POSITIVE

## 2021-02-08 ENCOUNTER — Inpatient Hospital Stay (HOSPITAL_COMMUNITY): Payer: 59 | Admitting: Anesthesiology

## 2021-02-08 ENCOUNTER — Encounter (HOSPITAL_COMMUNITY): Payer: Self-pay | Admitting: Obstetrics & Gynecology

## 2021-02-08 ENCOUNTER — Other Ambulatory Visit: Payer: Self-pay

## 2021-02-08 ENCOUNTER — Inpatient Hospital Stay (HOSPITAL_COMMUNITY)
Admission: AD | Admit: 2021-02-08 | Discharge: 2021-02-10 | DRG: 807 | Disposition: A | Discharge status: Home / Self Care | Payer: 59 | Attending: Obstetrics & Gynecology | Admitting: Obstetrics & Gynecology

## 2021-02-08 DIAGNOSIS — O99824 Streptococcus B carrier state complicating childbirth: Secondary | ICD-10-CM | POA: Diagnosis present

## 2021-02-08 DIAGNOSIS — O26893 Other specified pregnancy related conditions, third trimester: Secondary | ICD-10-CM | POA: Diagnosis present

## 2021-02-08 DIAGNOSIS — Z20822 Contact with and (suspected) exposure to covid-19: Secondary | ICD-10-CM | POA: Diagnosis present

## 2021-02-08 DIAGNOSIS — Z3A37 37 weeks gestation of pregnancy: Secondary | ICD-10-CM | POA: Diagnosis not present

## 2021-02-08 LAB — CBC
HCT: 41.6 % (ref 36.0–46.0)
Hemoglobin: 13.7 g/dL (ref 12.0–15.0)
MCH: 30.9 pg (ref 26.0–34.0)
MCHC: 32.9 g/dL (ref 30.0–36.0)
MCV: 93.7 fL (ref 80.0–100.0)
Platelets: 178 10*3/uL (ref 150–400)
RBC: 4.44 MIL/uL (ref 3.87–5.11)
RDW: 13.3 % (ref 11.5–15.5)
WBC: 13.2 10*3/uL — ABNORMAL HIGH (ref 4.0–10.5)
nRBC: 0.2 % (ref 0.0–0.2)

## 2021-02-08 LAB — ABO/RH: ABO/RH(D): A POS

## 2021-02-08 LAB — RESP PANEL BY RT-PCR (FLU A&B, COVID) ARPGX2
Influenza A by PCR: NEGATIVE
Influenza B by PCR: NEGATIVE
SARS Coronavirus 2 by RT PCR: NEGATIVE

## 2021-02-08 MED ORDER — PHENYLEPHRINE 40 MCG/ML (10ML) SYRINGE FOR IV PUSH (FOR BLOOD PRESSURE SUPPORT)
PREFILLED_SYRINGE | INTRAVENOUS | Status: AC
  Filled 2021-02-08: qty 10

## 2021-02-08 MED ORDER — OXYTOCIN-SODIUM CHLORIDE 30-0.9 UT/500ML-% IV SOLN
2.5000 [IU]/h | INTRAVENOUS | Status: DC
  Filled 2021-02-08: qty 500

## 2021-02-08 MED ORDER — FENTANYL CITRATE (PF) 100 MCG/2ML IJ SOLN
INTRAMUSCULAR | Status: AC
  Filled 2021-02-08: qty 2

## 2021-02-08 MED ORDER — ONDANSETRON HCL 4 MG/2ML IJ SOLN: 4.0000 mg | Freq: Four times a day (QID) | INTRAMUSCULAR | Status: DC | PRN

## 2021-02-08 MED ORDER — METHYLERGONOVINE MALEATE 0.2 MG/ML IJ SOLN
INTRAMUSCULAR | Status: AC
  Administered 2021-02-08: 0.2 mg
  Filled 2021-02-08: qty 1

## 2021-02-08 MED ORDER — ACETAMINOPHEN 325 MG PO TABS
650.0000 mg | ORAL_TABLET | ORAL | Status: DC | PRN
Start: 2021-02-08 — End: 2021-02-09

## 2021-02-08 MED ORDER — SOD CITRATE-CITRIC ACID 500-334 MG/5ML PO SOLN: 30.0000 mL | ORAL | Status: DC | PRN

## 2021-02-08 MED ORDER — OXYCODONE-ACETAMINOPHEN 5-325 MG PO TABS: 2.0000 | ORAL_TABLET | ORAL | Status: DC | PRN

## 2021-02-08 MED ORDER — TERBUTALINE SULFATE 1 MG/ML IJ SOLN: 0.2500 mg | Freq: Once | INTRAMUSCULAR | Status: DC

## 2021-02-08 MED ORDER — OXYCODONE-ACETAMINOPHEN 5-325 MG PO TABS
1.0000 | ORAL_TABLET | ORAL | Status: DC | PRN
Start: 2021-02-08 — End: 2021-02-09

## 2021-02-08 MED ORDER — TRANEXAMIC ACID-NACL 1000-0.7 MG/100ML-% IV SOLN
INTRAVENOUS | Status: AC
  Administered 2021-02-08: 1000 mg
  Filled 2021-02-08: qty 100

## 2021-02-08 MED ORDER — FLEET ENEMA 7-19 GM/118ML RE ENEM: 1.0000 | ENEMA | RECTAL | Status: DC | PRN

## 2021-02-08 MED ORDER — LACTATED RINGERS IV SOLN: INTRAVENOUS | Status: DC

## 2021-02-08 MED ORDER — TERBUTALINE SULFATE 1 MG/ML IJ SOLN
INTRAMUSCULAR | Status: AC
  Administered 2021-02-08: 0.25 mg
  Filled 2021-02-08: qty 1

## 2021-02-08 MED ORDER — TERBUTALINE SULFATE 1 MG/ML IJ SOLN
0.2500 mg | Freq: Once | INTRAMUSCULAR | Status: AC
  Administered 2021-02-08: 0.25 mg via SUBCUTANEOUS

## 2021-02-08 MED ORDER — OXYTOCIN BOLUS FROM INFUSION
333.0000 mL | Freq: Once | INTRAVENOUS | Status: AC
  Administered 2021-02-08: 333 mL via INTRAVENOUS

## 2021-02-08 MED ORDER — FENTANYL-BUPIVACAINE-NACL 0.5-0.125-0.9 MG/250ML-% EP SOLN
EPIDURAL | Status: DC | PRN
  Administered 2021-02-08: 12 mL/h via EPIDURAL

## 2021-02-08 MED ORDER — SODIUM CHLORIDE 0.9 % IV SOLN
1.0000 g | INTRAVENOUS | Status: DC
  Administered 2021-02-08 – 2021-02-09 (×2): 1 g via INTRAVENOUS
  Filled 2021-02-08: qty 1000

## 2021-02-08 MED ORDER — SODIUM CHLORIDE 0.9 % IV SOLN
2.0000 g | Freq: Once | INTRAVENOUS | Status: AC
  Administered 2021-02-08: 2 g via INTRAVENOUS
  Filled 2021-02-08: qty 2000

## 2021-02-08 MED ORDER — LIDOCAINE HCL (PF) 1 % IJ SOLN: 30.0000 mL | INTRAMUSCULAR | Status: DC | PRN

## 2021-02-08 MED ORDER — LIDOCAINE HCL (PF) 1 % IJ SOLN
INTRAMUSCULAR | Status: DC | PRN
  Administered 2021-02-08: 2 mL via EPIDURAL
  Administered 2021-02-08: 10 mL via EPIDURAL

## 2021-02-08 MED ORDER — LIDOCAINE-EPINEPHRINE (PF) 2 %-1:200000 IJ SOLN
INTRAMUSCULAR | Status: DC | PRN
  Administered 2021-02-08: 7 mL via EPIDURAL

## 2021-02-08 MED ORDER — FENTANYL CITRATE (PF) 100 MCG/2ML IJ SOLN
INTRAMUSCULAR | Status: DC | PRN
  Administered 2021-02-08: 100 ug via EPIDURAL

## 2021-02-08 MED ORDER — FENTANYL-BUPIVACAINE-NACL 0.5-0.125-0.9 MG/250ML-% EP SOLN
EPIDURAL | Status: AC
  Filled 2021-02-08: qty 250

## 2021-02-08 MED ORDER — LACTATED RINGERS IV SOLN
500.0000 mL | INTRAVENOUS | Status: DC | PRN
Start: 2021-02-08 — End: 2021-02-09
  Administered 2021-02-08: 500 mL via INTRAVENOUS

## 2021-02-08 NOTE — H&P (Signed)
Isabella Huff is a 25 y.o. female presenting in active labor,  at 5 cm dilation in MAU. UCs q 3 min. Some vag bleeding. No leaking. +Fms. 37.4 wks by Isabella Huff, Isabella Huff 02/25/21 [facility] from 10 wks.  1st trim bartholin's cyst I&D and warc catheter 1st trim Chlamydia infection, treated and test of cure and reinfection x 2 (1st trim and 3rd trim)  FOB and her mother supporting  Marginal cord, 28 wk growth 2'11" 50% AC nl. 35 wk, MCI resolved, growth 5'6" at 30% AC nl, Vx, AFI nl  Multiple BV and yeast vaginitis treatments through pregnancy    OB History     Gravida  1   Para      Term      Preterm      AB      Living         SAB      IAB      Ectopic      Multiple      Live Births             History reviewed. No pertinent past medical history. Past Surgical History:  Procedure Laterality Date   LYMPH GLAND EXCISION     TONSILLECTOMY     Family History: family history includes Allergic rhinitis in her brother and mother; Migraines in her maternal aunt. Social History:  reports that she has never smoked. She has never used smokeless tobacco. She reports that she does not drink alcohol and does not use drugs.     Maternal Diabetes: No Genetic Screening: Normal QUAD screen  Maternal Ultrasounds/Referrals: Normal anatomy and growth, Marginal cord , but resolved at 28 wk sono  Fetal Ultrasounds or other Referrals:  None Maternal Substance Abuse:  No Significant Maternal Medications:  None Significant Maternal Lab Results:  Group B Strep positive and Other: chlamydia in 1st trimester treated  Other Comments:  None  Review of Systems History Dilation: 6 Effacement (%): 80 Station: -1 Exam by:: Isabella Drown, RN Height 5\' 10"  (1.778 m), weight 92.1 kg, last menstrual period 05/21/2020. Exam Physical Exam  A&O x 3, no acute distress. Pleasant HEENT neg, no thyromegaly Lungs CTA bilat CV RRR, S1S2 normal Abdo soft, non tender, non acute Extr no edema/  tenderness Pelvic per RN, 6/70%/ -1/ Vx SROM upon arriving to L&D FHT 130s mod variability, intermittent variable decels but overall cat I Toco q 3 min, spontaneous   Prenatal labs: ABO, Rh: A+ Antibody: Neg Rubella:  Immu RPR:   Neg x 2 HBsAg:   Neg HIV:   Neg GBS:   Positive GLucola nl  QUAD screen neg   Assessment/Plan: 25 yo G1, 37.4 wks, active labor, SROM in hospital. GBS(+), Ampicillin due to advanced cervical dilation  Epidural now  FHT with recurrent variable decels- rapid progress vs cord compression. Terbutaline 0.25 mg SQ now while awaiting epidural and plan to place IUPC and amnioinfusion  EFW 6.1/2 lbs, anticipate SVD Dr 30 takes over call at 5 pm, pt and RN advised   Ernestina Penna 02/08/2021, 4:19 PM

## 2021-02-08 NOTE — Anesthesia Preprocedure Evaluation (Signed)
Anesthesia Evaluation  Patient identified by MRN, date of birth, ID band Patient awake    Reviewed: Allergy & Precautions, Patient's Chart, lab work & pertinent test results  Airway Mallampati: II  TM Distance: >3 FB Neck ROM: Full    Dental no notable dental hx.    Pulmonary asthma ,    Pulmonary exam normal breath sounds clear to auscultation       Cardiovascular negative cardio ROS Normal cardiovascular exam Rhythm:Regular Rate:Normal     Neuro/Psych negative neurological ROS  negative psych ROS   GI/Hepatic negative GI ROS, Neg liver ROS,   Endo/Other  negative endocrine ROS  Renal/GU negative Renal ROS  negative genitourinary   Musculoskeletal negative musculoskeletal ROS (+)   Abdominal   Peds negative pediatric ROS (+)  Hematology negative hematology ROS (+) hct 41.6, plt 178   Anesthesia Other Findings   Reproductive/Obstetrics (+) Pregnancy                             Anesthesia Physical Anesthesia Plan  ASA: 2  Anesthesia Plan: Epidural   Post-op Pain Management:    Induction:   PONV Risk Score and Plan: 2  Airway Management Planned: Natural Airway  Additional Equipment: None  Intra-op Plan:   Post-operative Plan:   Informed Consent: I have reviewed the patients History and Physical, chart, labs and discussed the procedure including the risks, benefits and alternatives for the proposed anesthesia with the patient or authorized representative who has indicated his/her understanding and acceptance.       Plan Discussed with:   Anesthesia Plan Comments:         Anesthesia Quick Evaluation

## 2021-02-08 NOTE — Progress Notes (Signed)
S: Doing well, no complaints, pain somewhat controlled with epidural though can still feel contractions and suprapubic pain, breathing with then  O: BP (!) 131/92   Pulse 67   Temp 98.2 F (36.8 C) (Oral)   Resp 18   Ht 5\' 10"  (1.778 m)   Wt 92.1 kg   LMP 05/21/2020   SpO2 100%   BMI 29.13 kg/m    FHT:  FHR: 150s bpm, variability: moderate,  accelerations:  Present,  decelerations:  Present variable decels with 50%+ of ctx, up to 1 min, a few more prolonged up to 3-4 minutes. Pt has needed terbutaline twice. IUPC then placed with amnioinfusion which has dropped decels to <50% of ctx and while 1 or 2 have late timing, most appear quick and variable. UC:   regular, every 4 minutes SVE:   Dilation: 8 Effacement (%): 100 Station: -1 Exam by:: M Early Repeat exam by with with placement of IUPC, 7/80%/ -1 with minimal cervical swelling  A / P:  25 y.o.  OB History  Gravida Para Term Preterm AB Living  1 0 0 0 0 0  SAB IAB Ectopic Multiple Live Births  0 0 0 0 0   at [redacted]w[redacted]d Spontaneous labor, progressing normally  Fetal Wellbeing:  Category I Pain Control:  Epidural  Anticipated MOD:   working toward SVD, station still high and baby needs to rotate. Watching decelerations closely.   [redacted]w[redacted]d 02/08/2021, 7:37 PM

## 2021-02-08 NOTE — Anesthesia Procedure Notes (Signed)
Epidural Patient location during procedure: OB Start time: 02/08/2021 4:11 PM End time: 02/08/2021 4:20 PM  Staffing Anesthesiologist: Lannie Fields, DO Performed: anesthesiologist   Preanesthetic Checklist Completed: patient identified, IV checked, risks and benefits discussed, monitors and equipment checked, pre-op evaluation and timeout performed  Epidural Patient position: sitting Prep: DuraPrep and site prepped and draped Patient monitoring: continuous pulse ox, blood pressure, heart rate and cardiac monitor Approach: midline Location: L3-L4 Injection technique: LOR air  Needle:  Needle type: Tuohy  Needle gauge: 17 G Needle length: 9 cm Needle insertion depth: 7 cm Catheter type: closed end flexible Catheter size: 19 Gauge Catheter at skin depth: 12 cm Test dose: negative  Assessment Sensory level: T8 Events: blood not aspirated, injection not painful, no injection resistance, no paresthesia and negative IV test  Additional Notes Patient identified. Risks/Benefits/Options discussed with patient including but not limited to bleeding, infection, nerve damage, paralysis, failed block, incomplete pain control, headache, blood pressure changes, nausea, vomiting, reactions to medication both or allergic, itching and postpartum back pain. Confirmed with bedside nurse the patient's most recent platelet count. Confirmed with patient that they are not currently taking any anticoagulation, have any bleeding history or any family history of bleeding disorders. Patient expressed understanding and wished to proceed. All questions were answered. Sterile technique was used throughout the entire procedure. Please see nursing notes for vital signs. Test dose was given through epidural catheter and negative prior to continuing to dose epidural or start infusion. Warning signs of high block given to the patient including shortness of breath, tingling/numbness in hands, complete motor  block, or any concerning symptoms with instructions to call for help. Patient was given instructions on fall risk and not to get out of bed. All questions and concerns addressed with instructions to call with any issues or inadequate analgesia.  Reason for block:procedure for pain

## 2021-02-08 NOTE — Progress Notes (Signed)
Correction to H&P.  Pt informs SROM at 10 am this morning, clear fluid

## 2021-02-08 NOTE — MAU Note (Signed)
Patient arrived to MAU c/o ctx and leakage of fluid that started around 1130/12 today. + FM. NO vaginal bleeding. Efm commenced

## 2021-02-09 ENCOUNTER — Encounter (HOSPITAL_COMMUNITY): Payer: Self-pay | Admitting: Obstetrics & Gynecology

## 2021-02-09 LAB — CBC
HCT: 35.6 % — ABNORMAL LOW (ref 36.0–46.0)
Hemoglobin: 12.2 g/dL (ref 12.0–15.0)
MCH: 30.3 pg (ref 26.0–34.0)
MCHC: 34.3 g/dL (ref 30.0–36.0)
MCV: 88.6 fL (ref 80.0–100.0)
Platelets: 205 10*3/uL (ref 150–400)
RBC: 4.02 MIL/uL (ref 3.87–5.11)
RDW: 13.1 % (ref 11.5–15.5)
WBC: 10.5 10*3/uL (ref 4.0–10.5)
nRBC: 0 % (ref 0.0–0.2)

## 2021-02-09 LAB — RPR: RPR Ser Ql: NONREACTIVE

## 2021-02-09 MED ORDER — OXYCODONE HCL 5 MG PO TABS: 5.0000 mg | ORAL_TABLET | ORAL | Status: DC | PRN

## 2021-02-09 MED ORDER — IBUPROFEN 600 MG PO TABS
600.0000 mg | ORAL_TABLET | Freq: Four times a day (QID) | ORAL | Status: DC
  Administered 2021-02-09 – 2021-02-10 (×6): 600 mg via ORAL
  Filled 2021-02-09 (×7): qty 1

## 2021-02-09 MED ORDER — SENNOSIDES-DOCUSATE SODIUM 8.6-50 MG PO TABS
2.0000 | ORAL_TABLET | ORAL | Status: DC
  Administered 2021-02-10: 2 via ORAL
  Filled 2021-02-09: qty 2

## 2021-02-09 MED ORDER — ACETAMINOPHEN 325 MG PO TABS
650.0000 mg | ORAL_TABLET | ORAL | Status: DC | PRN
  Administered 2021-02-10: 650 mg via ORAL
  Filled 2021-02-09 (×2): qty 2

## 2021-02-09 MED ORDER — TETANUS-DIPHTH-ACELL PERTUSSIS 5-2.5-18.5 LF-MCG/0.5 IM SUSY: 0.5000 mL | PREFILLED_SYRINGE | Freq: Once | INTRAMUSCULAR | Status: DC

## 2021-02-09 MED ORDER — PRENATAL MULTIVITAMIN CH
1.0000 | ORAL_TABLET | Freq: Every day | ORAL | Status: DC
  Administered 2021-02-10: 1 via ORAL
  Filled 2021-02-09: qty 1

## 2021-02-09 MED ORDER — ONDANSETRON HCL 4 MG PO TABS: 4.0000 mg | ORAL_TABLET | ORAL | Status: DC | PRN

## 2021-02-09 MED ORDER — DIPHENHYDRAMINE HCL 25 MG PO CAPS: 25.0000 mg | ORAL_CAPSULE | Freq: Four times a day (QID) | ORAL | Status: DC | PRN

## 2021-02-09 MED ORDER — COCONUT OIL OIL
1.0000 "application " | TOPICAL_OIL | Status: DC | PRN
  Administered 2021-02-09: 1 via TOPICAL

## 2021-02-09 MED ORDER — WITCH HAZEL-GLYCERIN EX PADS: 1.0000 "application " | MEDICATED_PAD | CUTANEOUS | Status: DC | PRN

## 2021-02-09 MED ORDER — OXYCODONE HCL 5 MG PO TABS: 10.0000 mg | ORAL_TABLET | ORAL | Status: DC | PRN

## 2021-02-09 MED ORDER — SIMETHICONE 80 MG PO CHEW: 80.0000 mg | CHEWABLE_TABLET | ORAL | Status: DC | PRN

## 2021-02-09 MED ORDER — BENZOCAINE-MENTHOL 20-0.5 % EX AERO
1.0000 "application " | INHALATION_SPRAY | CUTANEOUS | Status: DC | PRN
  Administered 2021-02-09: 1 via TOPICAL
  Filled 2021-02-09: qty 56

## 2021-02-09 MED ORDER — ONDANSETRON HCL 4 MG/2ML IJ SOLN: 4.0000 mg | INTRAMUSCULAR | Status: DC | PRN

## 2021-02-09 MED ORDER — ZOLPIDEM TARTRATE 5 MG PO TABS: 5.0000 mg | ORAL_TABLET | Freq: Every evening | ORAL | Status: DC | PRN

## 2021-02-09 MED ORDER — DIBUCAINE (PERIANAL) 1 % EX OINT: 1.0000 "application " | TOPICAL_OINTMENT | CUTANEOUS | Status: DC | PRN

## 2021-02-09 NOTE — Lactation Note (Signed)
This note was copied from a baby's chart. Lactation Consultation Note  Patient Name: Isabella Huff SHFWY'O Date: 02/09/2021 Reason for consult: Follow-up assessment;1st time breastfeeding;Primapara;Infant < 6lbs;Early term 37-38.6wks Age:23 hours   LC Follow Up Consult:  Previous LC requested I set up a DEBP for mother.  Baby has had low blood sugars of 28 mg/dl, 26 mg/dl and 38 mg/dl.  The last two blood sugars have been 54 mg/dl and, most recently (3785) 43 mg/dl.)   Arrived to find mother asleep.  Father holding baby and asked him to have the RN call when mother awakens so I can set up the DEBP.  Father appreciative and will call.  RN updated.   Maternal Data    Feeding Mother's Current Feeding Choice: Breast Milk and Formula Nipple Type: Nfant Slow Flow (purple)  LATCH Score                    Lactation Tools Discussed/Used    Interventions    Discharge    Consult Status Consult Status: Follow-up Date: 02/09/21 Follow-up type: In-patient    Daniah Zaldivar R Yuko Coventry 02/09/2021, 4:18 PM

## 2021-02-09 NOTE — Progress Notes (Signed)
PPD #1, SVD, 1st degree perineal repair, baby boy "Nasyiah"   S:  Reports feeling well, just tired, did not rest much overnight              Tolerating po/ No nausea or vomiting / Denies dizziness or SOB             Bleeding is getting lighter, no clots             Pain controlled with Motrin and heating pad              Up ad lib / ambulatory / voiding QS without difficulty   Newborn breast and formula feeding due to hypoglycemia / Circumcision - planning prior to d/c  O:               VS: BP 130/82 (BP Location: Right Arm)   Pulse 62   Temp 98.2 F (36.8 C) (Oral)   Resp 16   Ht 5\' 10"  (1.778 m)   Wt 92.1 kg   LMP 05/21/2020   SpO2 100%   Breastfeeding Unknown   BMI 29.13 kg/m  Patient Vitals for the past 24 hrs:  BP Temp Temp src Pulse Resp SpO2 Height Weight  02/09/21 0640 130/82 98.2 F (36.8 C) Oral 62 16 100 % -- --  02/09/21 0240 129/79 98.2 F (36.8 C) Oral 62 17 100 % -- --  02/09/21 0141 (!) 146/82 99.6 F (37.6 C) Oral 60 18 100 % -- --  02/09/21 0045 128/88 -- -- 66 17 -- -- --  02/09/21 0031 122/83 -- -- 65 18 -- -- --  02/09/21 0015 123/80 100.1 F (37.8 C) Axillary 71 17 -- -- --  02/09/21 0000 125/87 -- -- 82 18 -- -- --  02/08/21 2345 138/71 -- -- (!) 111 17 -- -- --  02/08/21 2343 (!) 141/95 -- -- 74 18 -- -- --  02/08/21 2319 -- 99.5 F (37.5 C) Oral -- -- -- -- --  02/08/21 2206 98/68 -- -- (!) 119 18 100 % -- --  02/08/21 2130 125/82 -- -- 87 17 100 % -- --  02/08/21 2121 119/76 -- -- (!) 101 17 100 % -- --  02/08/21 2116 111/74 -- -- (!) 102 18 100 % -- --  02/08/21 2111 (!) 111/91 -- -- (!) 108 17 100 % -- --  02/08/21 2106 -- -- -- -- -- 100 % -- --  02/08/21 2101 (!) 139/95 -- -- 100 18 95 % -- --  02/08/21 2056 124/83 -- -- 99 17 99 % -- --  02/08/21 2054 128/86 -- -- (!) 106 18 -- -- --  02/08/21 2052 121/83 -- -- 77 -- -- -- --  02/08/21 2051 -- -- -- -- -- 100 % -- --  02/08/21 2050 127/79 -- -- (!) 126 18 100 % -- --  02/08/21 2045 (!)  141/92 -- -- 90 17 99 % -- --  02/08/21 2041 -- -- -- -- -- 100 % -- --  02/08/21 2036 -- -- -- -- -- 99 % -- --  02/08/21 2030 117/70 -- -- 78 18 -- -- --  02/08/21 2006 122/65 -- -- 74 17 -- -- --  02/08/21 1931 (!) 147/74 -- -- 94 17 -- -- --  02/08/21 1834 (!) 131/92 98.2 F (36.8 C) Oral 67 18 -- -- --  02/08/21 1803 124/69 -- -- 65 18 -- -- --  02/08/21 1732 121/65 -- -- 69 18 100 % -- --  02/08/21 1650 121/68 -- -- 85 18 100 % -- --  02/08/21 1637 (!) 131/91 -- -- 77 18 100 % -- --  02/08/21 1630 137/89 -- -- (!) 104 18 -- -- --  02/08/21 1625 (!) 143/125 -- -- 95 16 -- -- --  02/08/21 1618 107/78 (!) 97.2 F (36.2 C) Axillary 92 18 99 % -- --  02/08/21 1617 -- -- -- -- -- 99 % -- --  02/08/21 1602 -- -- -- -- -- -- 5\' 10"  (1.778 m) 92.1 kg      LABS:              Recent Labs    02/08/21 1524  WBC 13.2*  HGB 13.7  PLT 178               Blood type: --/--/A POS Performed at [facility], [address], [place], [place]  [phone])  Rubella:                       I&O: Intake/Output      07/10 0701 07/11 0700 07/11 0701 07/12 0700   I.V. (mL/kg) 1013.7 (11)    Other 0    IV Piggyback 100    Total Intake(mL/kg) 1113.7 (12.1)    Urine (mL/kg/hr) 300    Blood 500    Total Output 800    Net +313.7         Urine Occurrence 1 x                  Physical Exam:             Alert and oriented X3  Lungs: Clear and unlabored  Heart: regular rate and rhythm / no murmurs  Abdomen: soft, non-tender, non-distended              Fundus: firm, non-tender, U-3  Perineum: well approximated 1st degree, mild edema   Lochia: appropriate   Extremities: no edema, no calf pain or tenderness    A/P: PPD # 1, SVD 1st degree perineal   - comfort measures reviewed S/p PPH   - repeat CBC today   Doing well - stable status Routine post partum orders  Lactation support PRN   Circ prior to d/c  Encouraged to rest when baby rests   Anticipate d/c home  tomorrow  09/12, MSN, CNM [facility]

## 2021-02-09 NOTE — Lactation Note (Signed)
This note was copied from a baby's chart. Lactation Consultation Note  Patient Name: Isabella Huff HYWVP'X Date: 02/09/2021 Reason for consult: Follow-up assessment;Mother's request;Early term 37-38.6wks Age:23 hours  LC in to room per mother's request. LC assisted with latch reviewing positioning, alignment. Demonstrated deep, asymmetrical latch as well as ways to keep infant awake at breast. Infant breastfed for ~8 minutes.  Discussed normal behavior and patterns first 24 HOL and due to ET. Spoonfed ~1 mL of colostrum. LC demonstrated paced bottlefeeding, frequent burping and upright position. Infant took ~10 mL of 24cal/oz formula using specialty lilac nipple. Set up DEBP, pumping frequency, care of parts and milk storage.  Mother reports cramps with breastfeeding and pumping.    Feeding plan:  1-Skin to skin 2-Aim for a deep, comfortable latch 3-Breastfeeding on demand or 8-12 times in 24h period. 4-Keep infant awake during breastfeeding session: massaging breast, infant's hand/shoulder/feet 5-Pump and supplement following guidelines, paced bottle feeding and fullness cues.  6-Preserve infant energy limiting feeding sessions to 30 min max.  7-Monitor voids and stools as signs good intake.  8-Encouraged maternal rest, hydration and food intake.  9-Contact LC as needed for feeds/support/concerns/questions   All questions answered at this time.   Maternal Data Has patient been taught Hand Expression?: Yes Does the patient have breastfeeding experience prior to this delivery?: No  Feeding Mother's Current Feeding Choice: Breast Milk and Formula  LATCH Score Latch: Grasps breast easily, tongue down, lips flanged, rhythmical sucking.  Audible Swallowing: A few with stimulation  Type of Nipple: Everted at rest and after stimulation  Comfort (Breast/Nipple): Soft / non-tender  Hold (Positioning): Assistance needed to correctly position infant at breast and maintain  latch.  LATCH Score: 8   Lactation Tools Discussed/Used Tools: Pump;Flanges;Coconut oil Flange Size: 27 Breast pump type: Double-Electric Breast Pump;Manual Pump Education: Setup, frequency, and cleaning;Milk Storage Reason for Pumping: SGA, stimulation and supplementation Pumping frequency: after feedings Pumped volume:  (has not started yet)  Interventions Interventions: Breast feeding basics reviewed;Assisted with latch;Skin to skin;Breast massage;Hand express;Adjust position;DEBP;Hand pump;Coconut oil;Expressed milk;Position options;Support pillows;Education  Discharge Pump: Personal;DEBP;Manual  Consult Status Consult Status: Follow-up Date: 02/10/21 Follow-up type: In-patient    Abrea Henle A Higuera Ancidey 02/09/2021, 8:30 PM

## 2021-02-09 NOTE — Lactation Note (Signed)
This note was copied from a baby's chart. Lactation Consultation Note  Patient Name: Isabella Huff Date: 02/09/2021 Reason for consult: Other (Comment) (spoke with [name] , regarding the low blood sugars and for now until stable, will bottle feed formula. per [name], mom is to sleepy to pump at this point. LC will F/U with mom ( patient . baby , and RN.) Age:23 hours - Serum blood sugars - 28 -26 - 38 . Critical levels.   Maternal Data    Feeding Mother's Current Feeding Choice: Breast Milk and Formula (due to low blood sugars) Nipple Type: Extra Slow Flow  LATCH Score                    Lactation Tools Discussed/Used    Interventions    Discharge    Consult Status Consult Status: Follow-up Date: 02/09/21 Follow-up type: In-patient    [name] 02/09/2021, 8:29 AM

## 2021-02-09 NOTE — Anesthesia Postprocedure Evaluation (Signed)
Anesthesia Post Note  Patient: Chanah Tidmore  Procedure(s) Performed: AN AD HOC LABOR EPIDURAL     Patient location during evaluation: Mother Baby Anesthesia Type: Epidural Level of consciousness: awake and alert and oriented Pain management: satisfactory to patient Vital Signs Assessment: post-procedure vital signs reviewed and stable Respiratory status: respiratory function stable Cardiovascular status: stable Postop Assessment: no headache, no backache, epidural receding, patient able to bend at knees, no signs of nausea or vomiting, adequate PO intake and able to ambulate Anesthetic complications: no   No notable events documented.  Last Vitals:  Vitals:   02/09/21 0240 02/09/21 0640  BP: 129/79 130/82  Pulse: 62 62  Resp: 17 16  Temp: 36.8 C 36.8 C  SpO2: 100% 100%    Last Pain:  Vitals:   02/09/21 0640  TempSrc: Oral  PainSc: 0-No pain   Pain Goal: Patients Stated Pain Goal: 0 (02/08/21 1529)                 Lita Flynn

## 2021-02-10 MED ORDER — IBUPROFEN 600 MG PO TABS: 600.0000 mg | ORAL_TABLET | Freq: Four times a day (QID) | ORAL | 0 refills | Status: DC

## 2021-02-10 MED ORDER — ACETAMINOPHEN 325 MG PO TABS: 650.0000 mg | ORAL_TABLET | ORAL | 1 refills | Status: DC | PRN

## 2021-02-10 NOTE — Lactation Note (Addendum)
This note was copied from a baby's chart. Lactation Consultation Note  Patient Name: Isabella Huff SFKCL'E Date: 02/10/2021 Reason for consult: Follow-up assessment;Mother's request;Early term 37-38.6wks;Infant < 6lbs;Hyperbilirubinemia Age:23 hours  On arrival infant resting under phototherapy lights. Mom stated that infant fed 1 hr ago Similac 24 cal/0z 10 ml. Infant had been spitty last 2 feedings so they had decreased volume and using Nfant nipple provided by SLP. Mom had not latched infant to the breast at the last feeding.   Mom currently resting. LC encouraged Mom to call for latch assistance with next feeding. LC also encouraged Mom to pump with DEBP q hrs for 15 min to maintain her milk supply.   LC alerted RN Ashok Cordia Mabe parents offer more since Mom not latching at the breast with the last feeding given infant 40 hrs old. Mom sleeping at the time of the [facility] visit. Mom need to start pumping to maintain her milk supply which LC asked RN to encourage.   Infant had 2 urine and 3 stool today.   Maternal Data    Feeding Mother's Current Feeding Choice: Breast Milk and Formula Nipple Type: Nfant Slow Flow (purple)  LATCH Score                    Lactation Tools Discussed/Used Tools: Pump;Flanges Breast pump type: Double-Electric Breast Pump Reason for Pumping: increase stimulation Pumping frequency: every 3 hrs for 15 min  Interventions Interventions: Breast feeding basics reviewed;Education;DEBP  Discharge    Consult Status Consult Status: Follow-up Date: 02/11/21 Follow-up type: In-patient    [name]  [name] 02/10/2021, 3:20 PM

## 2021-02-10 NOTE — Discharge Summary (Signed)
OB Discharge Summary  Patient Name: Isabella Huff DOB: [birth year] MRN: [id number]  Date of admission: 02/08/2021 Delivering provider: Noland Fordyce   Admitting diagnosis: Normal labor [O80, Z37.9] Intrauterine pregnancy: [redacted]w[redacted]d     Secondary diagnosis: Patient Active Problem List   Diagnosis Date Noted   SVD (spontaneous vaginal delivery) 02/09/2021   Postpartum care following vaginal delivery (7/10) 02/09/2021   First degree perineal laceration 02/09/2021   Normal labor 02/08/2021   Asthma 03/31/2015   Allergic rhinitis 03/31/2015    Date of discharge: 02/10/2021   Discharge diagnosis: Principal Problem:   Postpartum care following vaginal delivery (7/10) Active Problems:   Normal labor   SVD (spontaneous vaginal delivery)   First degree perineal laceration                                                           Post partum procedures: None  Augmentation: N/A Pain control: Epidural  Laceration:1st degree  Episiotomy:None  Complications: None  Hospital course:  Onset of Labor With Vaginal Delivery      25 y.o. yo G1P1001 at [redacted]w[redacted]d was admitted in Active Labor on 02/08/2021. Patient had an uncomplicated labor course as follows:  Membrane Rupture Time/Date: 11:30 AM ,02/08/2021   Delivery Method:Vaginal, Spontaneous  Episiotomy: None  Lacerations:  1st degree  Patient had an uncomplicated postpartum course.  She is ambulating, tolerating a regular diet, passing flatus, and urinating well. Patient is discharged home in stable condition on 02/10/21.  Newborn Data: Birth date:02/08/2021  Birth time:11:26 PM  Gender:Female  Living status:Living  Apgars:8 ,9  Weight:2265 g   Physical exam  Vitals:   02/09/21 0640 02/09/21 1059 02/09/21 1528 02/09/21 2037  BP: 130/82 132/87 109/72 126/80  Pulse: 62 (!) 54 60 66  Resp: 16 16 18 20   Temp: 98.2 F (36.8 C) 98.7 F (37.1 C) 98.7 F (37.1 C) 98.4 F (36.9 C)  TempSrc: Oral Oral Oral Oral  SpO2: 100% 100% 100%   Weight:       Height:       General: alert, cooperative, and no distress Lochia: appropriate Uterine Fundus: firm Incision: N/A Perineum: repair intact, no edema DVT Evaluation: No evidence of DVT seen on physical exam. No significant calf/ankle edema. Labs: Lab Results  Component Value Date   WBC 10.5 02/09/2021   HGB 12.2 02/09/2021   HCT 35.6 (L) 02/09/2021   MCV 88.6 02/09/2021   PLT 205 02/09/2021   CMP Latest Ref Rng & Units 02/20/2016  Glucose 65 - 99 mg/dL 83  BUN 6 - 20 mg/dL 13  Creatinine 02/22/2016 - 5.80 mg/dL 9.98  Sodium 3.38 - 250 mmol/L 136  Potassium 3.5 - 5.1 mmol/L 3.8  Chloride 101 - 111 mmol/L 105  CO2 22 - 32 mmol/L 22  Calcium 8.9 - 10.3 mg/dL 9.3   Edinburgh Postnatal Depression Scale Screening Tool 02/09/2021  I have been able to laugh and see the funny side of things. 0  I have looked forward with enjoyment to things. 0  I have blamed myself unnecessarily when things went wrong. 1  I have been anxious or worried for no good reason. 1  I have felt scared or panicky for no good reason. 0  Things have been getting on top of me. 0  I have been so unhappy that  I have had difficulty sleeping. 1  I have felt sad or miserable. 1  I have been so unhappy that I have been crying. 0  The thought of harming myself has occurred to me. 0  Edinburgh Postnatal Depression Scale Total 4   Vaccines: TDaP UTD         COVID-19   ?  Discharge instructions:  per After Visit Summary  After Visit Meds:  Allergies as of 02/10/2021   No Known Allergies      Medication List     STOP taking these medications    albuterol 108 (90 Base) MCG/ACT inhaler Commonly known as: VENTOLIN HFA   desogestrel-ethinyl estradiol 0.15-0.02/0.01 MG (21/5) tablet Commonly known as: MIRCETTE   EPIPEN 2-PAK IJ   levocetirizine 5 MG tablet Commonly known as: XYZAL   loratadine 10 MG tablet Commonly known as: CLARITIN       TAKE these medications    acetaminophen 325 MG  tablet Commonly known as: Tylenol Take 2 tablets (650 mg total) by mouth every 4 (four) hours as needed (for pain scale < 4).   ibuprofen 600 MG tablet Commonly known as: ADVIL Take 1 tablet (600 mg total) by mouth every 6 (six) hours. What changed:  when to take this reasons to take this   PRENATAL VITAMINS PO Take by mouth.       Diet: routine diet  Activity: Advance as tolerated. Pelvic rest for 6 weeks.   Newborn Data: Live born female  Birth Weight: 4 lb 15.9 oz (2265 g) APGAR: 8, 9  Newborn Delivery   Birth date/time: 02/08/2021 23:26:00 Delivery type: Vaginal, Spontaneous     Named Nasyiah Baby Feeding: Bottle and Breast Disposition:rooming in  Delivery Report:  Review the Delivery Report for details.    Follow up:  Follow-up Information     Shea Evans, MD. Schedule an appointment as soon as possible for a visit in 6 week(s).   Specialty: Obstetrics and Gynecology Contact information: [address] [place] [address] [phone]                Clancy Gourd, MSN 02/10/2021, 11:44 AM

## 2021-02-11 ENCOUNTER — Ambulatory Visit: Payer: Self-pay

## 2021-02-11 LAB — TYPE AND SCREEN
ABO/RH(D): A POS
Antibody Screen: POSITIVE
Unit division: 0
Unit division: 0

## 2021-02-11 LAB — BPAM RBC
Blood Product Expiration Date: 202208072359
Blood Product Expiration Date: 202208072359
Unit Type and Rh: 6200
Unit Type and Rh: 6200

## 2021-02-11 NOTE — Lactation Note (Signed)
This note was copied from a baby's chart. Lactation Consultation Note  Patient Name: Isabella Huff HWEXH'B Date: 02/11/2021 Reason for consult: Follow-up assessment;Early term 37-38.6wks;Infant < 6lbs;Hyperbilirubinemia Age:23 hours  LC in to visit with P1 Mom of ET infant.  Baby at 3% weight loss.  Triple phototherapy DC'd this afternoon.    Baby sleeping STS on Mom's chest. Mom states baby latches to breast, but falls asleep after a few sucks. Baby being supplemented with 24 cal formula by bottle.  Mom states she hasn't pumped today.   LC assisted Mom to pump on initiation setting, using 27 mm flanges.  Encouraged Mom to increase her frequency of pumping to support a full milk supply.  Mom states she will try.   Answered all Mom's questions for now.    Lactation Tools Discussed/Used Tools: Pump;Flanges;Bottle Flange Size: 27 Breast pump type: Double-Electric Breast Pump Pump Education: Setup, frequency, and cleaning Pumping frequency: First time pumping today at 1731, encouraged pumping every 3 hrs after baby feeds Pumped volume: 0 mL (drops)  Interventions Interventions: Breast feeding basics reviewed;Skin to skin;Breast massage;Hand express;DEBP  Discharge Pump: Personal  Consult Status Consult Status: Follow-up Date: 02/12/21 Follow-up type: In-patient    [name] 02/11/2021, 5:32 PM

## 2021-02-12 ENCOUNTER — Ambulatory Visit: Payer: Self-pay

## 2021-02-12 NOTE — Lactation Note (Signed)
This note was copied from a baby's chart. Lactation Consultation Note  Patient Name: Boy Keya Wynes KGYJE'H Date: 02/12/2021 Reason for consult: Follow-up assessment;Early term 37-38.6wks;Hyperbilirubinemia;Other (Comment);Primapara;1st time breastfeeding;Infant < 6lbs;Infant weight loss (bilirubin today - 10.1. /1 % weight loss) Age:23 days LC reviewed the [facility] plan of care  Feed with feeding cues and by 3 hours feed the baby. ( 8-12 times in 24 hours). Due to baby being Early term / less than 6 hours/ continue to feed 15 -20 mins / supplement - increasing to 30 ml of EBM or formula.  Post pump both breast for 15 -20 mins / save milk for the next feeding.  LC reviewed and updated the doc flow sheets per mom.  LC reviewed potential feeding behaviors with early term / less than 6 pound infant.  Per mom will be seeing the Lactation consultant at the [facility] office tomorrow.  Mom has the [facility] brochure with resources numbers.   Maternal Data    Feeding Mother's Current Feeding Choice: Breast Milk and Formula  LATCH Score                    Lactation Tools Discussed/Used Tools: Shells;Pump;Flanges;Coconut oil Flange Size: 27;24;30 Breast pump type: Manual;Double-Electric Breast Pump Pump Education: Milk Storage Reason for Pumping: Early term baby/ less than 5 pounds,  Interventions Interventions: Breast feeding basics reviewed;Shells;Hand pump;DEBP;Education;Coconut oil  Discharge Discharge Education: Engorgement and breast care;Warning signs for feeding baby;Other (comment) (per mom will be F/U tomorrow with [facility] office Consultant) Pump: DEBP;Personal;Manual  Consult Status Consult Status: Complete Date: 02/12/21    Kathrin Greathouse 02/12/2021, 10:59 AM

## 2021-02-20 ENCOUNTER — Telehealth (HOSPITAL_COMMUNITY): Payer: Self-pay | Admitting: *Deleted

## 2021-02-20 NOTE — Telephone Encounter (Signed)
Mom reports feeling soreness in her abdomen when she wakes up in the mornings. No excessive bleeding or cramping. No burning or pain with urination. Talked to [facility] yesterday about headaches after epidural. Will pick up meds today. Feeling anxious at times. EPDS = 7 [facility] score = 4). Baby is doing well per mom. Breastfeeding without difficulty. No concerns with baby.  Duffy Rhody, RN 02/20/2021 at 12:00pm

## 2022-05-07 ENCOUNTER — Encounter (HOSPITAL_BASED_OUTPATIENT_CLINIC_OR_DEPARTMENT_OTHER): Payer: Self-pay

## 2022-05-07 ENCOUNTER — Emergency Department (HOSPITAL_BASED_OUTPATIENT_CLINIC_OR_DEPARTMENT_OTHER)
Admission: EM | Admit: 2022-05-07 | Discharge: 2022-05-07 | Disposition: A | Discharge status: Home / Self Care | Payer: 59 | Attending: Emergency Medicine | Admitting: Emergency Medicine

## 2022-05-07 ENCOUNTER — Other Ambulatory Visit: Payer: Self-pay

## 2022-05-07 DIAGNOSIS — U071 COVID-19: Secondary | ICD-10-CM | POA: Diagnosis not present

## 2022-05-07 DIAGNOSIS — R059 Cough, unspecified: Secondary | ICD-10-CM | POA: Diagnosis present

## 2022-05-07 LAB — SARS CORONAVIRUS 2 BY RT PCR: SARS Coronavirus 2 by RT PCR: POSITIVE — AB

## 2022-05-07 NOTE — ED Provider Notes (Signed)
[facility] EMERGENCY DEPT Provider Note   CSN: [id number] Arrival date & time: 05/07/22  3818     History  Chief Complaint  Patient presents with   Covid test     Isabella Huff is a 26 y.o. female.  Pt complains of a sore throat, cough and congestion.  Pt thinks that she may have covid.  Pt request a covid test.     Cough Cough characteristics:  Non-productive Sputum characteristics:  Nondescript Severity:  Mild Onset quality:  Gradual Timing:  Constant Progression:  Worsening Associated symptoms: sore throat        Home Medications Prior to Admission medications   Medication Sig Start Date End Date Taking? Authorizing Provider  acetaminophen (TYLENOL) 325 MG tablet Take 2 tablets (650 mg total) by mouth every 4 (four) hours as needed (for pain scale < 4). 02/10/21   June Leap, CNM  ibuprofen (ADVIL) 600 MG tablet Take 1 tablet (600 mg total) by mouth every 6 (six) hours. 02/10/21   June Leap, CNM  Prenatal Vit-Fe Fumarate-FA (PRENATAL VITAMINS PO) Take by mouth.    [provider]      Allergies    Patient has no known allergies.    Review of Systems   Review of Systems  HENT:  Positive for sore throat.   Respiratory:  Positive for cough.   All other systems reviewed and are negative.   Physical Exam Updated Vital Signs BP 118/79 (BP Location: Right Arm)   Pulse 81   Temp 98.2 F (36.8 C) (Oral)   Resp 16   Ht 5\' 10"  (1.778 m)   Wt 86.2 kg   SpO2 98%   BMI 27.26 kg/m  Physical Exam Vitals and nursing note reviewed.  Constitutional:      Appearance: She is well-developed.  HENT:     Head: Normocephalic.  Cardiovascular:     Rate and Rhythm: Normal rate.  Pulmonary:     Effort: Pulmonary effort is normal.  Abdominal:     General: There is no distension.  Musculoskeletal:        General: Normal range of motion.     Cervical back: Normal range of motion.  Neurological:     Mental Status: She is alert and  oriented to person, place, and time.     ED Results / Procedures / Treatments   Labs (all labs ordered are listed, but only abnormal results are displayed) Labs Reviewed  SARS CORONAVIRUS 2 BY RT PCR - Abnormal; Notable for the following components:      Result Value   SARS Coronavirus 2 by RT PCR POSITIVE (*)    All other components within normal limits    EKG None  Radiology No results found.  Procedures Procedures    Medications Ordered in ED Medications - No data to display  ED Course/ Medical Decision Making/ A&P                           Medical Decision Making Pt had a positive home covid test.  Pt request test here for employer.    Amount and/or Complexity of Data Reviewed Labs: ordered. Decision-making details documented in ED Course.    Details: Labs ordered reviewed and interpreted   Risk Risk Details: Pt advised tylenol, encouraged to drink plenty of fluids            Final Clinical Impression(s) / ED Diagnoses Final diagnoses:  COVID  Rx / DC Orders ED Discharge Orders     None     An After Visit Summary was printed and given to the patient.     Fransico Meadow, PA-C 05/07/22 1156    Leanord Asal K, DO 05/07/22 1609

## 2022-05-07 NOTE — ED Notes (Signed)
Patient verbalizes understanding of discharge instructions. Opportunity for questioning and answers were provided. Patient discharged from ED.  °

## 2022-05-07 NOTE — ED Triage Notes (Signed)
Pt states she took covid test at home and results were positive. States she needs official test for job.

## 2022-06-21 ENCOUNTER — Encounter (HOSPITAL_COMMUNITY): Payer: Self-pay

## 2022-06-21 ENCOUNTER — Emergency Department (HOSPITAL_COMMUNITY)
Admission: EM | Admit: 2022-06-21 | Discharge: 2022-06-22 | Disposition: A | Discharge status: Home / Self Care | Payer: 59 | Attending: Emergency Medicine | Admitting: Emergency Medicine

## 2022-06-21 ENCOUNTER — Other Ambulatory Visit: Payer: Self-pay

## 2022-06-21 ENCOUNTER — Emergency Department (HOSPITAL_COMMUNITY): Payer: 59

## 2022-06-21 DIAGNOSIS — R1032 Left lower quadrant pain: Secondary | ICD-10-CM | POA: Diagnosis present

## 2022-06-21 LAB — URINALYSIS, ROUTINE W REFLEX MICROSCOPIC
Bilirubin Urine: NEGATIVE
Glucose, UA: NEGATIVE mg/dL
Hgb urine dipstick: NEGATIVE
Ketones, ur: NEGATIVE mg/dL
Leukocytes,Ua: NEGATIVE
Nitrite: NEGATIVE
Protein, ur: NEGATIVE mg/dL
Specific Gravity, Urine: 1.019 (ref 1.005–1.030)
pH: 6 (ref 5.0–8.0)

## 2022-06-21 LAB — PREGNANCY, URINE: Preg Test, Ur: NEGATIVE

## 2022-06-21 NOTE — ED Triage Notes (Signed)
Pt arrived POV complaining of left sided abdominal pain. She is concerned that her IUD got dislodged, states that she heard of popping sound last night.   Pt states IUD was placed about 7yr ago, has not had problems with it.   Denies discharge and denies N/V

## 2022-06-21 NOTE — ED Provider Triage Note (Signed)
Emergency Medicine Provider Triage Evaluation Note  Isabella Huff , a 26 y.o. female  was evaluated in triage.  Pt complains of lower abdominal pain.  Patient states that she has had chronic low abdominal pain for the past 2 to 3 months related to her IUD placed.  IUD has been in for approximately 1 year.  She reports hearing a popping noise last night with a shift of pain to her left lower abdomen.  Last menstrual period beginning within the first week of this month.  Denies vaginal discharge/bleeding currently.  Reports some urinary frequency.  Denies fever, chills, night sweats, nausea, vomiting, change in bowel habits.  Review of Systems  Positive: See above Negative:   Physical Exam  BP 119/71 (BP Location: Right Arm)   Pulse 81   Temp 98.7 F (37.1 C)   Resp 16   Ht 5\' 10"  (1.778 m)   Wt 86.2 kg   SpO2 100%   BMI 27.26 kg/m  Gen:   Awake, no distress   Resp:  Normal effort  MSK:   Moves extremities without difficulty  Other:  Left lower quadrant abdominal pain.  Medical Decision Making  Medically screening exam initiated at 8:15 PM.  Appropriate orders placed.  Carolyn Sylvia was informed that the remainder of the evaluation will be completed by another provider, this initial triage assessment does not replace that evaluation, and the importance of remaining in the ED until their evaluation is complete.     Deri Fuelling, Peter Garter 06/21/22 2023

## 2022-06-22 MED ORDER — NAPROXEN 500 MG PO TABS: 500.0000 mg | ORAL_TABLET | Freq: Two times a day (BID) | ORAL | 0 refills | Status: DC

## 2022-06-22 NOTE — Discharge Instructions (Signed)
You were evaluated in the Emergency Department and after careful evaluation, we did not find any emergent condition requiring admission or further testing in the hospital.  Your exam/testing today was overall reassuring.  Recommend using the Naprosyn anti-inflammatory as needed for pain, follow-up with your PCP or OB/GYN if your symptoms continue.  Please return to the Emergency Department if you experience any worsening of your condition.  Thank you for allowing [place] to be a part of your care.

## 2022-06-22 NOTE — ED Provider Notes (Signed)
[facility]-EMERGENCY DEPT [facility] Emergency Department Provider Note MRN:  [id number]  Arrival date & time: 06/22/22     Chief Complaint   Abdominal Pain   History of Present Illness   Isabella Huff is a 26 y.o. year-old female with no pertinent past medical history presenting to the ED with chief complaint of abdominal pain.  Left lower quadrant abdominal pain since last night, feels like her IUD shifted or is in the wrong place.  Last menstrual period about 2 weeks ago.  Denies any vaginal bleeding or discharge, no dysuria or hematuria, no fever, no nausea vomiting or diarrhea, no other complaints.  Review of Systems  A thorough review of systems was obtained and all systems are negative except as noted in the HPI and PMH.   Patient's Health History   History reviewed. No pertinent past medical history.  Past Surgical History:  Procedure Laterality Date   LYMPH GLAND EXCISION     TONSILLECTOMY      Family History  Problem Relation Age of Onset   Allergic rhinitis Mother    Allergic rhinitis Brother    Migraines Maternal Aunt    Asthma Neg Hx    Eczema Neg Hx    Urticaria Neg Hx    Immunodeficiency Neg Hx    Angioedema Neg Hx     Social History   Socioeconomic History   Marital status: Significant Other    Spouse name: Not on file   Number of children: Not on file   Years of education: Not on file   Highest education level: Not on file  Occupational History   Not on file  Tobacco Use   Smoking status: Never   Smokeless tobacco: Never  Vaping Use   Vaping Use: Never used  Substance and Sexual Activity   Alcohol use: No   Drug use: No   Sexual activity: Not on file  Other Topics Concern   Not on file  Social History Narrative   Not on file   Social Determinants of Health   Financial Resource Strain: Not on file  Food Insecurity: Not on file  Transportation Needs: Not on file  Physical Activity: Not on file  Stress: Not on file  Social Connections:  Not on file  Intimate Partner Violence: Not on file     Physical Exam   Vitals:   06/21/22 2010 06/21/22 2312  BP: 119/71 128/73  Pulse: 81 63  Resp: 16 16  Temp: 98.7 F (37.1 C)   SpO2: 100% 100%    CONSTITUTIONAL: Well-appearing, NAD NEURO/PSYCH:  Alert and oriented x 3, no focal deficits EYES:  eyes equal and reactive ENT/NECK:  no LAD, no JVD CARDIO: Regular rate, well-perfused, normal S1 and S2 PULM:  CTAB no wheezing or rhonchi GI/GU:  non-distended, non-tender MSK/SPINE:  No gross deformities, no edema SKIN:  no rash, atraumatic   *Additional and/or pertinent findings included in MDM below  Diagnostic and Interventional Summary    EKG Interpretation  Date/Time:    Ventricular Rate:    PR Interval:    QRS Duration:   QT Interval:    QTC Calculation:   R Axis:     Text Interpretation:         Labs Reviewed  URINALYSIS, ROUTINE W REFLEX MICROSCOPIC  PREGNANCY, URINE    US Pelvis Complete  Final Result    [place] Art/Ven Flow Abd Pelv Doppler  Final Result      Medications - No data to display  Procedures  /  Critical Care Procedures  ED Course and Medical Decision Making  Initial Impression and Ddx Differential diagnosis includes ovarian cyst, UTI, constipation, mittelschmerz, ectopic pregnancy, IUD dislodgment  Past medical/surgical history that increases complexity of ED encounter: None  Interpretation of Diagnostics hCG negative, urinalysis normal, ultrasound reassuring with no IUD issues, no ovarian cysts, fibroids Patient Reassessment and Ultimate Disposition/Management     Patient's abdomen is completely soft and nontender, no rebound guarding or rigidity, definitely has no right-sided pain to suggest appendicitis.  Vital signs are completely normal.  Denies any vaginal discharge or concern for yeast infection and/or STDs.  Pelvic exam deferred.  Shared decision-making utilized with the patient, blood testing and CT imaging seems to be low  yield at this time, highly doubt diverticulitis or emergent intra-abdominal process.  Advised PCP/OB/GYN follow-up if symptoms do not resolve.  Patient management required discussion with the following services or consulting groups:  None  Complexity of Problems Addressed Acute illness or injury that poses threat of life of bodily function  Additional Data Reviewed and Analyzed Further history obtained from: Further history from spouse/family member  Additional Factors Impacting ED Encounter Risk Prescriptions  Elmer Sow. Pilar Plate, MD [facility] Emergency Medicine [facility] [email or web address]  Final Clinical Impressions(s) / ED Diagnoses     ICD-10-CM   1. Left lower quadrant abdominal pain  R10.32       ED Discharge Orders          Ordered    naproxen (NAPROSYN) 500 MG tablet  2 times daily        06/22/22 0144             Discharge Instructions Discussed with and Provided to Patient:    Discharge Instructions      You were evaluated in the Emergency Department and after careful evaluation, we did not find any emergent condition requiring admission or further testing in the hospital.  Your exam/testing today was overall reassuring.  Recommend using the Naprosyn anti-inflammatory as needed for pain, follow-up with your PCP or OB/GYN if your symptoms continue.  Please return to the Emergency Department if you experience any worsening of your condition.  Thank you for allowing [place] to be a part of your care.       Sabas Sous, MD 06/22/22 [phone]

## 2022-08-31 ENCOUNTER — Emergency Department (HOSPITAL_BASED_OUTPATIENT_CLINIC_OR_DEPARTMENT_OTHER)
Admission: EM | Admit: 2022-08-31 | Discharge: 2022-08-31 | Disposition: A | Discharge status: Home / Self Care | Payer: 59 | Attending: Emergency Medicine | Admitting: Emergency Medicine

## 2022-08-31 ENCOUNTER — Other Ambulatory Visit: Payer: Self-pay

## 2022-08-31 DIAGNOSIS — Z3201 Encounter for pregnancy test, result positive: Secondary | ICD-10-CM | POA: Diagnosis not present

## 2022-08-31 LAB — PREGNANCY, URINE: Preg Test, Ur: POSITIVE — AB

## 2022-08-31 NOTE — ED Triage Notes (Signed)
Pt had positive pregnancy test at home and wants to confirm.  LMP in December

## 2022-08-31 NOTE — Discharge Instructions (Signed)
Please read and follow all provided instructions.  Your diagnoses today include:  1. Positive pregnancy test    Tests performed today include: Vital signs. See below for your results today.  Pregnancy test was positive  Safe Medications in Pregnancy   Acne: Benzoyl Peroxide Salicylic Acid  Backache/Headache: Tylenol: 2 regular strength every 4 hours OR              2 Extra strength every 6 hours  Colds/Coughs/Allergies: Benadryl (alcohol free) 25 mg every 6 hours as needed Breath right strips Claritin Cepacol throat lozenges Chloraseptic throat spray Cold-Eeze- up to three times per day Cough drops, alcohol free Flonase (by prescription only) Guaifenesin Mucinex Robitussin DM (plain only, alcohol free) Saline nasal spray/drops Sudafed (pseudoephedrine) & Actifed ** use only after [redacted] weeks gestation and if you do not have high blood pressure Tylenol Vicks Vaporub Zinc lozenges Zyrtec   Constipation: Colace Ducolax suppositories Fleet enema Glycerin suppositories Metamucil Milk of magnesia Miralax Senokot Smooth move tea  Diarrhea: Kaopectate Imodium A-D  *NO pepto Bismol  Hemorrhoids: Anusol Anusol HC Preparation H Tucks  Indigestion: Tums Maalox Mylanta Zantac  Pepcid  Insomnia: Benadryl (alcohol free) 25mg  every 6 hours as needed Tylenol PM Unisom, no Gelcaps  Leg Cramps: Tums MagGel  Nausea/Vomiting:  Bonine Dramamine Emetrol Ginger extract Sea bands Meclizine  Nausea medication to take during pregnancy:  Unisom (doxylamine succinate 25 mg tablets) Take one tablet daily at bedtime. If symptoms are not adequately controlled, the dose can be increased to a maximum recommended dose of two tablets daily (1/2 tablet in the morning, 1/2 tablet mid-afternoon and one at bedtime). Vitamin B6 100mg  tablets. Take one tablet twice a day (up to 200 mg per day).  Skin Rashes: Aveeno products Benadryl cream or 25mg  every 6 hours as  needed Calamine Lotion 1% cortisone cream  Yeast infection: Gyne-lotrimin 7 Monistat 7  Gum/tooth pain: Anbesol  **If taking multiple medications, please check labels to avoid duplicating the same active ingredients **take medication as directed on the label ** Do not exceed 4000 mg of tylenol in 24 hours **Do not take medications that contain aspirin or ibuprofen     Home care instructions:  Follow any educational materials contained in this packet.  Follow-up instructions: Please follow-up with your primary care provider as needed for further evaluation of your symptoms.  Return instructions:  Please return to the Emergency Department if you experience worsening symptoms.  Please return if you have any other emergent concerns.  Additional Information:  Your vital signs today were: BP 95/61 (BP Location: Right Arm)   Pulse 96   Temp 98.3 F (36.8 C) (Oral)   Resp 20   SpO2 99%  If your blood pressure (BP) was elevated above 135/85 this visit, please have this repeated by your doctor within one month. ---------------

## 2022-08-31 NOTE — ED Provider Notes (Signed)
[place] [place] Provider Note   CSN: [id number] Arrival date & time: 08/31/22  2050     History  Chief Complaint  Patient presents with   Possible Pregnancy    Isabella Huff is a 27 y.o. female.  Patient presents to the emergency department today for evaluation of possible pregnancy.  She states that her last normal menstrual period was in November and in December she had a irregular period with milder bleeding.  She has had decreased appetite and nausea as well.  No vaginal bleeding or discharge.  She reports some mild abdominal cramping at times, but not persistent.  She presents tonight for confirmation of a positive pregnancy test that she had at home.       Home Medications Prior to Admission medications   Medication Sig Start Date End Date Taking? Authorizing Provider  acetaminophen (TYLENOL) 325 MG tablet Take 2 tablets (650 mg total) by mouth every 4 (four) hours as needed (for pain scale < 4). 02/10/21   Suzan Nailer, CNM  ibuprofen (ADVIL) 600 MG tablet Take 1 tablet (600 mg total) by mouth every 6 (six) hours. 02/10/21   Suzan Nailer, CNM  naproxen (NAPROSYN) 500 MG tablet Take 1 tablet (500 mg total) by mouth 2 (two) times daily. 06/22/22   Maudie Flakes, MD  Prenatal Vit-Fe Fumarate-FA (PRENATAL VITAMINS PO) Take by mouth.    [provider]      Allergies    Patient has no known allergies.    Review of Systems   Review of Systems  Physical Exam Updated Vital Signs BP 95/61 (BP Location: Right Arm)   Pulse 96   Temp 98.3 F (36.8 C) (Oral)   Resp 20   SpO2 99%  Physical Exam Vitals and nursing note reviewed.  Constitutional:      Appearance: She is well-developed.  HENT:     Head: Normocephalic and atraumatic.  Eyes:     Conjunctiva/sclera: Conjunctivae normal.  Pulmonary:     Effort: No respiratory distress.  Musculoskeletal:     Cervical back: Normal range of motion and neck supple.   Skin:    General: Skin is warm and dry.  Neurological:     Mental Status: She is alert.     ED Results / Procedures / Treatments   Labs (all labs ordered are listed, but only abnormal results are displayed) Labs Reviewed  PREGNANCY, URINE - Abnormal; Notable for the following components:      Result Value   Preg Test, Ur POSITIVE (*)    All other components within normal limits    EKG None  Radiology No results found.  Procedures Procedures    Medications Ordered in ED Medications - No data to display  ED Course/ Medical Decision Making/ A&P    Patient seen and examined. History obtained directly from patient. Work-up including labs, imaging, EKG ordered in triage, if performed, were reviewed.    Labs/EKG: Independently reviewed and interpreted.  This included: Pregnancy test, positive  Imaging: None ordered  Medications/Fluids: None ordered  Most recent vital signs reviewed and are as follows: BP 95/61 (BP Location: Right Arm)   Pulse 96   Temp 98.3 F (36.8 C) (Oral)   Resp 20   SpO2 99%   Initial impression: Positive pregnancy test, nausea  Home treatment plan: Discussed use of doxylamine and B6 in setting of nausea in pregnancy, will provide information on appropriate use.  Return instructions discussed with  patient: Return with abdominal pain, vaginal bleeding.  Follow-up instructions discussed with patient: Follow-up with OB/GYN for routine care                              Medical Decision Making Amount and/or Complexity of Data Reviewed Labs: ordered.   Patient here for confirmation of positive pregnancy test.  She has had some nausea but no vaginal bleeding, discharge, significant pain.  Encouraged OB/GYN follow-up.         Final Clinical Impression(s) / ED Diagnoses Final diagnoses:  Positive pregnancy test    Rx / DC Orders ED Discharge Orders     None         Carlisle Cater, PA-C 08/31/22 2250    Margette Fast, MD 08/31/22 2338

## 2023-06-24 ENCOUNTER — Emergency Department (HOSPITAL_BASED_OUTPATIENT_CLINIC_OR_DEPARTMENT_OTHER)
Admission: EM | Admit: 2023-06-24 | Discharge: 2023-06-24 | Disposition: A | Discharge status: Home / Self Care | Payer: 59 | Attending: Emergency Medicine | Admitting: Emergency Medicine

## 2023-06-24 ENCOUNTER — Other Ambulatory Visit: Payer: Self-pay

## 2023-06-24 ENCOUNTER — Encounter (HOSPITAL_BASED_OUTPATIENT_CLINIC_OR_DEPARTMENT_OTHER): Payer: Self-pay

## 2023-06-24 DIAGNOSIS — Y92410 Unspecified street and highway as the place of occurrence of the external cause: Secondary | ICD-10-CM | POA: Diagnosis not present

## 2023-06-24 DIAGNOSIS — M542 Cervicalgia: Secondary | ICD-10-CM | POA: Diagnosis not present

## 2023-06-24 DIAGNOSIS — M549 Dorsalgia, unspecified: Secondary | ICD-10-CM | POA: Diagnosis not present

## 2023-06-24 DIAGNOSIS — Z3A00 Weeks of gestation of pregnancy not specified: Secondary | ICD-10-CM | POA: Diagnosis not present

## 2023-06-24 DIAGNOSIS — O0990 Supervision of high risk pregnancy, unspecified, unspecified trimester: Secondary | ICD-10-CM | POA: Insufficient documentation

## 2023-06-24 DIAGNOSIS — R5383 Other fatigue: Secondary | ICD-10-CM | POA: Diagnosis not present

## 2023-06-24 DIAGNOSIS — Z3490 Encounter for supervision of normal pregnancy, unspecified, unspecified trimester: Secondary | ICD-10-CM

## 2023-06-24 DIAGNOSIS — O9A219 Injury, poisoning and certain other consequences of external causes complicating pregnancy, unspecified trimester: Secondary | ICD-10-CM | POA: Insufficient documentation

## 2023-06-24 DIAGNOSIS — R110 Nausea: Secondary | ICD-10-CM | POA: Insufficient documentation

## 2023-06-24 LAB — PREGNANCY, URINE: Preg Test, Ur: POSITIVE — AB

## 2023-06-24 MED ORDER — ACETAMINOPHEN 500 MG PO TABS: 500.0000 mg | ORAL_TABLET | Freq: Four times a day (QID) | ORAL | 0 refills | Status: AC | PRN

## 2023-06-24 MED ORDER — CYCLOBENZAPRINE HCL 10 MG PO TABS: 10.0000 mg | ORAL_TABLET | Freq: Two times a day (BID) | ORAL | 0 refills | Status: AC | PRN

## 2023-06-24 NOTE — ED Notes (Signed)
Pt alert and oriented X 4 at the time of discharge. RR even and unlabored. No acute distress noted. Pt verbalized understanding of discharge instructions as discussed. Pt ambulatory to lobby at time of discharge.

## 2023-06-24 NOTE — Discharge Instructions (Signed)
You have been evaluated for your symptoms.  No concern for any broken bone during this exam.  Please take Tylenol and muscle relaxant as needed for pain.  Your pregnancy test came back positive.  You may follow-up with OB/GYN for further care.  Please start taking prenatal vitamin.
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
## 2023-08-27 ENCOUNTER — Emergency Department (HOSPITAL_BASED_OUTPATIENT_CLINIC_OR_DEPARTMENT_OTHER)
Admission: EM | Admit: 2023-08-27 | Discharge: 2023-08-27 | Disposition: A | Discharge status: Home / Self Care | Payer: Self-pay | Attending: Emergency Medicine | Admitting: Emergency Medicine

## 2023-08-27 ENCOUNTER — Encounter (HOSPITAL_BASED_OUTPATIENT_CLINIC_OR_DEPARTMENT_OTHER): Payer: Self-pay

## 2023-08-27 ENCOUNTER — Emergency Department (HOSPITAL_BASED_OUTPATIENT_CLINIC_OR_DEPARTMENT_OTHER): Payer: Self-pay

## 2023-08-27 DIAGNOSIS — R202 Paresthesia of skin: Secondary | ICD-10-CM | POA: Diagnosis not present

## 2023-08-27 DIAGNOSIS — M79675 Pain in left toe(s): Secondary | ICD-10-CM | POA: Insufficient documentation

## 2023-08-27 NOTE — ED Triage Notes (Addendum)
Hit left 4th toe on bed yesterday, swelling noted. States unable to bend toe.  Recently had abortion on December 7th, has continued to have bleeding since then. States wants to address that as well.

## 2023-08-27 NOTE — ED Provider Notes (Signed)
[name] Provider Note   CSN: [id number] Arrival date & time: 08/27/23  0846     History  Chief Complaint  Patient presents with   Toe Injury    Isabella Huff is a 28 y.o. female who presents with left toe pain.  Patient states she stubbed her left fourth toe last night and has been painful since.  No prior injury or surgery to this toe.  She does report some tingling sensation radiating from the toe to the plantar aspect of her foot.  She has been able to weight-bear some discomfort.  Patient additionally mentioned persistent vaginal bleeding since abortion on December 7 in triage.  When I brought this up during her exam, patient states she would prefer just to follow-up with her OB/GYN.  Reports she changes her pad once a day and denies any dizziness.  HPI     Home Medications Prior to Admission medications   Medication Sig Start Date End Date Taking? Authorizing Provider  acetaminophen (TYLENOL) 500 MG tablet Take 1 tablet (500 mg total) by mouth every 6 (six) hours as needed. 06/24/23   Fayrene Helper, PA-C  cyclobenzaprine (FLEXERIL) 10 MG tablet Take 1 tablet (10 mg total) by mouth 2 (two) times daily as needed for muscle spasms. 06/24/23   Fayrene Helper, PA-C  Prenatal Vit-Fe Fumarate-FA (PRENATAL VITAMINS PO) Take by mouth.    [provider]      Allergies    Dust mite extract and Molds & smuts    Review of Systems   Review of Systems  Musculoskeletal:  Positive for arthralgias and myalgias.    Physical Exam Updated Vital Signs BP 136/72 (BP Location: Right Arm)   Pulse 89   Temp (!) 97.4 F (36.3 C) (Oral)   Resp 16   Ht 5\' 10"  (1.778 m)   Wt 79.4 kg   LMP 08/27/2023 (Approximate) Comment: patient states that she has been on period since December  SpO2 100%   BMI 25.11 kg/m  Physical Exam Vitals and nursing note reviewed.  Constitutional:      General: She is not in acute distress.    Appearance: She  is well-developed.  HENT:     Head: Normocephalic and atraumatic.  Eyes:     Conjunctiva/sclera: Conjunctivae normal.  Cardiovascular:     Rate and Rhythm: Normal rate and regular rhythm.     Heart sounds: No murmur heard. Pulmonary:     Effort: Pulmonary effort is normal. No respiratory distress.     Breath sounds: Normal breath sounds.  Abdominal:     Palpations: Abdomen is soft.     Tenderness: There is no abdominal tenderness.  Musculoskeletal:     Cervical back: Neck supple.     Comments: Examination of left foot demonstrates tenderness to fourth digit proximal phalanx, there is no significant swelling or ecchymosis, she tolerates gentle range of motion of the digit, there is no overlying erythema or warmth.  DP pulses are symmetric, she is NVI, cap refill less than 2 secs  Skin:    General: Skin is warm and dry.     Capillary Refill: Capillary refill takes less than 2 seconds.  Neurological:     Mental Status: She is alert.  Psychiatric:        Mood and Affect: Mood normal.     ED Results / Procedures / Treatments   Labs (all labs ordered are listed, but only abnormal results are displayed) Labs Reviewed -  No data to display  EKG None  Radiology DG Foot Complete Left Result Date: 08/27/2023 CLINICAL DATA:  Left fourth toe pain after trauma EXAM: LEFT FOOT - COMPLETE 3 VIEW COMPARISON:  None Available. FINDINGS: There is no evidence of fracture or dislocation. There is no evidence of arthropathy or other focal bone abnormality. Soft tissues are unremarkable. IMPRESSION: No acute fracture or dislocation. Electronically Signed   By: Agustin Cree M.D.   On: 08/27/2023 09:45    Procedures Procedures    Medications Ordered in ED Medications - No data to display  ED Course/ Medical Decision Making/ A&P                                 Medical Decision Making Amount and/or Complexity of Data Reviewed Radiology: ordered.   This patient presents to the ED with chief  complaint(s) of left toe pain.  The complaint involves an extensive differential diagnosis and also carries with it a high risk of complications and morbidity.   pertinent past medical history as listed in HPI  The differential diagnosis includes  Septic joint, gout, fracture, sprain, dislocation The initial plan is to  Will obtain plain films of the foot Additional history obtained: Records reviewed previous admission documents and Care Everywhere/External Records  Initial Assessment:   Hemodynamically stable, afebrile patient presenting with traumatic left toe pain.  Exam notable for tenderness over proximal phalanx of fourth toe.  Although there is no significant swelling or ecchymosis on exam and no obvious fracture on x-ray, given focal tenderness she could have an occult fracture.  No history of gout.  No erythema, warmth to suggest septic joint or gout.  She additionally tolerates gentle range of motion of the toe.  She is afebrile.  Independent ECG interpretation:  none  Independent labs interpretation:  The following labs were independently interpreted:  none  Independent visualization and interpretation of imaging: I independently visualized the following imaging with scope of interpretation limited to determining acute life threatening conditions related to emergency care: left toe, which revealed no obvious acute abnormality  Treatment and Reassessment: No medications administered during visit  Consultations obtained:   none  Disposition:   Patient will be discharged home.  Toe will be buddy taped and placed in postop shoe.  Provided referral to orthopedics should symptoms persist. The patient has been appropriately medically screened and/or stabilized in the ED. I have low suspicion for any other emergent medical condition which would require further screening, evaluation or treatment in the ED or require inpatient management. At time of discharge the patient is  hemodynamically stable and in no acute distress. I have discussed work-up results and diagnosis with patient and answered all questions. Patient is agreeable with discharge plan. We discussed strict return precautions for returning to the emergency department and they verbalized understanding.     Social Determinants of Health:   none  This note was dictated with voice recognition software.  Despite best efforts at proofreading, errors may have occurred which can change the documentation meaning.          Final Clinical Impression(s) / ED Diagnoses Final diagnoses:  Pain in left toe(s)    Rx / DC Orders ED Discharge Orders     None         Fabienne Bruns 08/27/23 1014    Terald Sleeper, MD 08/27/23 1500

## 2023-08-27 NOTE — Discharge Instructions (Addendum)
You were evaluated in the emergency room for left toe pain.  An x-ray was taken which did not show an obvious fracture.  You could have a sprain or a fracture that is not yet visible or obvious on x-ray.  Your toe was buddy taped and you are placed in a postop shoe.  Please remain in that shoe while weightbearing. You can take Tylenol 1000 mg x 3 times a day and if pain persist you can alternate this with Motrin 600 to 800 mg a day on a full stomach.  You were provided a referral for orthopedics.  If symptoms persist please call make an appointment within the next week.
